# Patient Record
Sex: Male | Born: 1985
Health system: Southern US, Community
[De-identification: ages and names within clinical notes are randomized; demographics above are authoritative.]

## PROBLEM LIST (undated history)

## (undated) DIAGNOSIS — M1990 Unspecified osteoarthritis, unspecified site: Secondary | ICD-10-CM

## (undated) DIAGNOSIS — J45909 Unspecified asthma, uncomplicated: Secondary | ICD-10-CM

## (undated) DIAGNOSIS — J189 Pneumonia, unspecified organism: Secondary | ICD-10-CM

## (undated) HISTORY — PX: KNEE SURGERY: SHX244

---
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
## 2008-06-23 ENCOUNTER — Encounter: Admission: RE | Admit: 2008-06-23 | Discharge: 2008-06-23 | Payer: Self-pay | Admitting: Family Medicine

## 2009-07-06 ENCOUNTER — Encounter: Admission: RE | Admit: 2009-07-06 | Discharge: 2009-07-06 | Payer: Self-pay | Admitting: Orthopaedic Surgery

## 2009-10-06 ENCOUNTER — Encounter: Admission: RE | Admit: 2009-10-06 | Discharge: 2009-10-06 | Payer: Self-pay | Admitting: Orthopaedic Surgery

## 2009-11-07 ENCOUNTER — Encounter: Admission: RE | Admit: 2009-11-07 | Discharge: 2009-11-07 | Payer: Self-pay | Admitting: Orthopaedic Surgery

## 2010-06-07 ENCOUNTER — Encounter
Admission: RE | Admit: 2010-06-07 | Discharge: 2010-06-07 | Payer: Self-pay | Source: Home / Self Care | Attending: Internal Medicine | Admitting: Internal Medicine

## 2011-11-13 ENCOUNTER — Ambulatory Visit (INDEPENDENT_AMBULATORY_CARE_PROVIDER_SITE_OTHER): Payer: Self-pay | Admitting: Sports Medicine

## 2011-11-13 VITALS — BP 120/70 | Ht 72.0 in | Wt 240.0 lb

## 2011-11-13 DIAGNOSIS — R269 Unspecified abnormalities of gait and mobility: Secondary | ICD-10-CM

## 2011-11-13 DIAGNOSIS — M79671 Pain in right foot: Secondary | ICD-10-CM

## 2011-11-13 DIAGNOSIS — M79609 Pain in unspecified limb: Secondary | ICD-10-CM

## 2011-11-13 NOTE — Assessment & Plan Note (Signed)
Patient was fitted for a : standard, cushioned, semi-rigid orthotic. The orthotic was heated and afterward the patient stood on the orthotic blank positioned on the orthotic stand. The patient was positioned in subtalar neutral position and 10 degrees of ankle dorsiflexion in a weight bearing stance. After completion of molding, a stable base was applied to the orthotic blank. The blank was ground to a stable position for weight bearing. Size: 12 black suede EVA and red EVA Base: medium blue EVA Posting:none Additional orthotic padding:none  Gait was much improved in both his running shoes and his golf shoes when we placed orthotics in them  I think he needs to continue in orthotics as his gait is so abnormal that he would be very high risk for midfoot arthritis and ankle arthritis in the future   Use these and we will recheck him if he has difficulty with them

## 2011-11-13 NOTE — Assessment & Plan Note (Signed)
I think this is related to his biomechanical issues He has good posterior tibialis function He does not appear to have any functional spring ligament on the right or left which leads to marked pes planus and midfoot rotation

## 2011-11-13 NOTE — Progress Notes (Signed)
Patient ID: Bob [name], male   DOB: 03-28-1986, 26 y.o.   MRN: [id number]  HPI:  New pt evaluation for foot pain "all my life." Sent for evaluation by Dr Thurston Hole  Pt complains of having flat feet.  Is active and feels like his toes have blisters frequently because he does not have an arch.  + soreness on whole bottom of feet, especially after a lot of activity/running/golfing.  Has had orthotics by a foot specialist previously-- were plastic and painful.  No medications.  Walks/run ~ 20-30 mil per week (between running for exercise and golfing 18 holes ~4x/week)  Now having back pain-- ruptures L4-5, narrowing of canal and bulging disk-- improved with strengthening exercises and weight loss; 2011, required steroid injections, not helped by po pain meds; has L left numbness which has resolved   Surgery: h/o surgery on R knee for partial MCL tear-- 2003 or 2004  PE:  Filed Vitals:   11/13/11 1131  BP: 120/70   Gen; NAD, pleasant Feet:  + callus mid forefoot bilaterally Healing blisters between toes, left worse than right Fallen arch bilateral feet, collapse of mid-foot bilaterally Increased spacing between 1st and 2nd toes bilaterally External rotation of bilateral feet with walking  Midfoot collapse and rotation Rear foot shows calcaneus is neutral  gais shows marked pronation

## 2013-04-22 ENCOUNTER — Ambulatory Visit (INDEPENDENT_AMBULATORY_CARE_PROVIDER_SITE_OTHER): Payer: PRIVATE HEALTH INSURANCE | Admitting: Psychology

## 2013-04-22 DIAGNOSIS — F6381 Intermittent explosive disorder: Secondary | ICD-10-CM

## 2013-05-06 ENCOUNTER — Ambulatory Visit: Payer: 59 | Admitting: Psychology

## 2014-02-16 ENCOUNTER — Emergency Department (HOSPITAL_COMMUNITY)
Admission: EM | Admit: 2014-02-16 | Discharge: 2014-02-16 | Disposition: A | Discharge status: Home / Self Care | Payer: 59 | Attending: Emergency Medicine | Admitting: Emergency Medicine

## 2014-02-16 ENCOUNTER — Encounter (HOSPITAL_COMMUNITY): Payer: Self-pay | Admitting: Emergency Medicine

## 2014-02-16 DIAGNOSIS — J45909 Unspecified asthma, uncomplicated: Secondary | ICD-10-CM | POA: Diagnosis not present

## 2014-02-16 DIAGNOSIS — L03211 Cellulitis of face: Secondary | ICD-10-CM | POA: Diagnosis not present

## 2014-02-16 DIAGNOSIS — L0201 Cutaneous abscess of face: Secondary | ICD-10-CM | POA: Insufficient documentation

## 2014-02-16 HISTORY — DX: Unspecified asthma, uncomplicated: J45.909

## 2014-02-16 MED ORDER — SULFAMETHOXAZOLE-TRIMETHOPRIM 800-160 MG PO TABS: 1.0000 | ORAL_TABLET | Freq: Two times a day (BID) | ORAL | Status: DC

## 2014-02-16 MED ORDER — SULFAMETHOXAZOLE-TMP DS 800-160 MG PO TABS
1.0000 | ORAL_TABLET | Freq: Once | ORAL | Status: AC
  Administered 2014-02-16: 1 via ORAL
  Filled 2014-02-16: qty 1

## 2014-02-16 MED ORDER — OXYCODONE-ACETAMINOPHEN 5-325 MG PO TABS: 1.0000 | ORAL_TABLET | ORAL | Status: DC | PRN

## 2014-02-16 MED ORDER — OXYCODONE-ACETAMINOPHEN 5-325 MG PO TABS
1.0000 | ORAL_TABLET | Freq: Once | ORAL | Status: AC
  Administered 2014-02-16: 1 via ORAL
  Filled 2014-02-16: qty 1

## 2014-02-16 NOTE — Discharge Instructions (Signed)

## 2014-02-16 NOTE — ED Provider Notes (Signed)
CSN: [id number]     Arrival date & time 02/16/14  6295 History   First MD Initiated Contact with Patient 02/16/14 [phone]     Chief Complaint  Patient presents with  . Insect Bite     (Consider location/radiation/quality/duration/timing/severity/associated sxs/prior Treatment) Patient is a 28 y.o. male presenting with abscess. The history is provided by the patient. No language interpreter was used.  Abscess Location:  Face Facial abscess location:  L cheek Size:  Small Abscess quality: painful   Abscess quality: not draining   Red streaking: no   Associated symptoms: no fever   Associated symptoms comment:  Small "pimple" to left cheek he tried to open at home yesterday. Today pain and swelling around the area has increased. No fever. No drainage from the area.    Past Medical History  Diagnosis Date  . Asthma    Past Surgical History  Procedure Laterality Date  . Knee surgery     No family history on file. History  Substance Use Topics  . Smoking status: Never Smoker   . Smokeless tobacco: Not on file  . Alcohol Use: Yes     Comment: social    Review of Systems  Constitutional: Negative for fever.  HENT: Positive for facial swelling.   Eyes: Negative for pain.  Skin: Positive for wound.      Allergies  Review of patient's allergies indicates no known allergies.  Home Medications   Prior to Admission medications   Not on File   BP 131/79  Pulse 65  Temp(Src) 97.6 F (36.4 C) (Oral)  Resp 16  SpO2 97% Physical Exam  Constitutional: He is oriented to person, place, and time. He appears well-developed and well-nourished.  Eyes: Left eye exhibits no discharge.  FROM without pain.  Neck: Normal range of motion. Neck supple.  Pulmonary/Chest: Effort normal.  Musculoskeletal: Normal range of motion.  Lymphadenopathy:    He has no cervical adenopathy.  Neurological: He is alert and oriented to person, place, and time.  Skin: Skin is warm and dry.  Small  pustule left cheek lateral to eye. No erythema. Significantly tender. No fluctuance or induration.   Psychiatric: He has a normal mood and affect.    ED Course  Procedures (including critical care time) Labs Review Labs Reviewed - No data to display  Imaging Review No results found.   EKG Interpretation None      MDM   Final diagnoses:  None    1. Facial abscess  No fluctuance or induration so will not attempt to I&D. Will place on abx, provide pain management and strongly encourage close follow up with PCP Kirby Funk) for recheck.     Arnoldo Hooker, PA-C 02/16/14 [phone]

## 2014-02-16 NOTE — ED Notes (Signed)
Here for wound/abscess/bite check. Noticed area yesterday. Thought it was a pimple and tried to "pop" it.  Minimal redness & swelling.  Denies pain, but TTP. (Denies: itching, cramping, nvd or other sx). No drainage noted. Mentions blurry vision. No meds or topicals PTA.

## 2014-02-17 NOTE — ED Provider Notes (Signed)
Medical screening examination/treatment/procedure(s) were performed by non-physician practitioner and as supervising physician I was immediately available for consultation/collaboration.   EKG Interpretation None       Derwood Kaplan, MD 02/17/14 8119

## 2014-05-14 HISTORY — PX: OTHER SURGICAL HISTORY: SHX169

## 2014-11-02 ENCOUNTER — Ambulatory Visit (INDEPENDENT_AMBULATORY_CARE_PROVIDER_SITE_OTHER): Payer: 59 | Admitting: Internal Medicine

## 2014-11-02 ENCOUNTER — Encounter: Payer: Self-pay | Admitting: Internal Medicine

## 2014-11-02 VITALS — BP 122/80 | HR 64 | Temp 98.0°F | Resp 18 | Ht 72.0 in | Wt 277.0 lb

## 2014-11-02 DIAGNOSIS — Z1329 Encounter for screening for other suspected endocrine disorder: Secondary | ICD-10-CM

## 2014-11-02 DIAGNOSIS — Z1389 Encounter for screening for other disorder: Secondary | ICD-10-CM

## 2014-11-02 DIAGNOSIS — E669 Obesity, unspecified: Secondary | ICD-10-CM | POA: Insufficient documentation

## 2014-11-02 DIAGNOSIS — Z13220 Encounter for screening for lipoid disorders: Secondary | ICD-10-CM

## 2014-11-02 DIAGNOSIS — Z79899 Other long term (current) drug therapy: Secondary | ICD-10-CM | POA: Insufficient documentation

## 2014-11-02 DIAGNOSIS — Z131 Encounter for screening for diabetes mellitus: Secondary | ICD-10-CM

## 2014-11-02 DIAGNOSIS — F988 Other specified behavioral and emotional disorders with onset usually occurring in childhood and adolescence: Secondary | ICD-10-CM

## 2014-11-02 DIAGNOSIS — Z130 Encounter for screening for diseases of the blood and blood-forming organs and certain disorders involving the immune mechanism: Secondary | ICD-10-CM

## 2014-11-02 DIAGNOSIS — E559 Vitamin D deficiency, unspecified: Secondary | ICD-10-CM | POA: Insufficient documentation

## 2014-11-02 DIAGNOSIS — R5383 Other fatigue: Secondary | ICD-10-CM

## 2014-11-02 DIAGNOSIS — J45909 Unspecified asthma, uncomplicated: Secondary | ICD-10-CM | POA: Insufficient documentation

## 2014-11-02 DIAGNOSIS — Z0000 Encounter for general adult medical examination without abnormal findings: Secondary | ICD-10-CM

## 2014-11-02 LAB — MAGNESIUM: Magnesium: 2.1 mg/dL (ref 1.5–2.5)

## 2014-11-02 LAB — BASIC METABOLIC PANEL WITH GFR
BUN: 17 mg/dL (ref 6–23)
CALCIUM: 9.1 mg/dL (ref 8.4–10.5)
CO2: 25 meq/L (ref 19–32)
CREATININE: 0.86 mg/dL (ref 0.50–1.35)
Chloride: 101 mEq/L (ref 96–112)
GFR, Est African American: >89 mL/min
GFR, Est Non African American: >89 mL/min
GLUCOSE: 98 mg/dL (ref 70–99)
Potassium: 4.7 mEq/L (ref 3.5–5.3)
Sodium: 137 mEq/L (ref 135–145)

## 2014-11-02 LAB — HEPATIC FUNCTION PANEL
ALK PHOS: 91 U/L (ref 39–117)
ALT: 31 U/L (ref 0–53)
AST: 28 U/L (ref 0–37)
Albumin: 4.4 g/dL (ref 3.5–5.2)
BILIRUBIN INDIRECT: 0.2 mg/dL (ref 0.2–1.2)
Bilirubin, Direct: 0.1 mg/dL (ref 0.0–0.3)
Total Bilirubin: 0.3 mg/dL (ref 0.2–1.2)
Total Protein: 6.9 g/dL (ref 6.0–8.3)

## 2014-11-02 LAB — CBC WITH DIFFERENTIAL/PLATELET
BASOS ABS: 0.1 10*3/uL (ref 0.0–0.1)
BASOS PCT: 1 % (ref 0–1)
EOS ABS: 0.1 10*3/uL (ref 0.0–0.7)
Eosinophils Relative: 2 % (ref 0–5)
HCT: 42.2 % (ref 39.0–52.0)
Hemoglobin: 14.3 g/dL (ref 13.0–17.0)
LYMPHS ABS: 1.8 10*3/uL (ref 0.7–4.0)
LYMPHS PCT: 25 % (ref 12–46)
MCH: 29.6 pg (ref 26.0–34.0)
MCHC: 33.9 g/dL (ref 30.0–36.0)
MCV: 87.4 fL (ref 78.0–100.0)
MPV: 9 fL (ref 8.6–12.4)
Monocytes Absolute: 0.5 10*3/uL (ref 0.1–1.0)
Monocytes Relative: 7 % (ref 3–12)
NEUTROS ABS: 4.6 10*3/uL (ref 1.7–7.7)
NEUTROS PCT: 65 % (ref 43–77)
Platelets: 299 10*3/uL (ref 150–400)
RBC: 4.83 MIL/uL (ref 4.22–5.81)
RDW: 13.7 % (ref 11.5–15.5)
WBC: 7.1 10*3/uL (ref 4.0–10.5)

## 2014-11-02 LAB — IRON AND TIBC
%SAT: 31 % (ref 20–55)
Iron: 124 ug/dL (ref 42–165)
TIBC: 395 ug/dL (ref 215–435)
UIBC: 271 ug/dL (ref 125–400)

## 2014-11-02 LAB — VITAMIN B12: Vitamin B-12: 407 pg/mL (ref 211–911)

## 2014-11-02 LAB — LIPID PANEL
CHOL/HDL RATIO: 5 ratio
CHOLESTEROL: 219 mg/dL — AB (ref 0–200)
HDL: 44 mg/dL (ref >=40)
LDL CALC: 124 mg/dL — AB (ref 0–99)
Triglycerides: 256 mg/dL — ABNORMAL HIGH (ref <150)
VLDL: 51 mg/dL — ABNORMAL HIGH (ref 0–40)

## 2014-11-02 LAB — HEMOGLOBIN A1C
HEMOGLOBIN A1C: 5.8 % — AB (ref <5.7)
Mean Plasma Glucose: 120 mg/dL — ABNORMAL HIGH (ref <117)

## 2014-11-02 LAB — TSH: TSH: 2.568 u[IU]/mL (ref 0.350–4.500)

## 2014-11-02 MED ORDER — ALBUTEROL SULFATE HFA 108 (90 BASE) MCG/ACT IN AERS: 2.0000 | INHALATION_SPRAY | Freq: Four times a day (QID) | RESPIRATORY_TRACT | Status: DC | PRN

## 2014-11-02 MED ORDER — AMPHETAMINE-DEXTROAMPHETAMINE 20 MG PO TABS: 20.0000 mg | ORAL_TABLET | Freq: Two times a day (BID) | ORAL | Status: DC

## 2014-11-02 NOTE — Patient Instructions (Signed)
Preventive Care for Adults  A healthy lifestyle and preventive care can promote health and wellness. Preventive health guidelines for men include the following key practices:  A routine yearly physical is a good way to check with your health care provider about your health and preventative screening. It is a chance to share any concerns and updates on your health and to receive a thorough exam.  Visit your dentist for a routine exam and preventative care every 6 months. Brush your teeth twice a day and floss once a day. Good oral hygiene prevents tooth decay and gum disease.  The frequency of eye exams is based on your age, health, family medical history, use of contact lenses, and other factors. Follow your health care provider's recommendations for frequency of eye exams.  Eat a healthy diet. Foods such as vegetables, fruits, whole grains, low-fat dairy products, and lean protein foods contain the nutrients you need without too many calories. Decrease your intake of foods high in solid fats, added sugars, and salt. Eat the right amount of calories for you.Get information about a proper diet from your health care provider, if necessary.  Regular physical exercise is one of the most important things you can do for your health. Most adults should get at least 150 minutes of moderate-intensity exercise (any activity that increases your heart rate and causes you to sweat) each week. In addition, most adults need muscle-strengthening exercises on 2 or more days a week.  Maintain a healthy weight. The body mass index (BMI) is a screening tool to identify possible weight problems. It provides an estimate of body fat based on height and weight. Your health care provider can find your BMI and can help you achieve or maintain a healthy weight.For adults 20 years and older:  A BMI below 18.5 is considered underweight.  A BMI of 18.5 to 24.9 is normal.  A BMI of 25 to 29.9 is considered overweight.  A  BMI of 30 and above is considered obese.  Maintain normal blood lipids and cholesterol levels by exercising and minimizing your intake of saturated fat. Eat a balanced diet with plenty of fruit and vegetables. Blood tests for lipids and cholesterol should begin at age 20 and be repeated every 5 years. If your lipid or cholesterol levels are high, you are over 50, or you are at high risk for heart disease, you may need your cholesterol levels checked more frequently.Ongoing high lipid and cholesterol levels should be treated with medicines if diet and exercise are not working.  If you smoke, find out from your health care provider how to quit. If you do not use tobacco, do not start.  Lung cancer screening is recommended for adults aged 55-80 years who are at high risk for developing lung cancer because of a history of smoking. A yearly low-dose CT scan of the lungs is recommended for people who have at least a 30-pack-year history of smoking and are a current smoker or have quit within the past 15 years. A pack year of smoking is smoking an average of 1 pack of cigarettes a day for 1 year (for example: 1 pack a day for 30 years or 2 packs a day for 15 years). Yearly screening should continue until the smoker has stopped smoking for at least 15 years. Yearly screening should be stopped for people who develop a health problem that would prevent them from having lung cancer treatment.  If you choose to drink alcohol, do not have more   than 2 drinks per day. One drink is considered to be 12 ounces (355 mL) of beer, 5 ounces (148 mL) of wine, or 1.5 ounces (44 mL) of liquor.  High blood pressure causes heart disease and increases the risk of stroke. Your blood pressure should be checked. Ongoing high blood pressure should be treated with medicines, if weight loss and exercise are not effective.  If you are 38-65 years old, ask your health care provider if you should take aspirin to prevent heart  disease.  Diabetes screening involves taking a blood sample to check your fasting blood sugar level. Testing should be considered at a younger age or be carried out more frequently if you are overweight and have at least 1 risk factor for diabetes.  Colorectal cancer can be detected and often prevented. Most routine colorectal cancer screening begins at the age of 56 and continues through age 26. However, your health care provider may recommend screening at an earlier age if you have risk factors for colon cancer. On a yearly basis, your health care provider may provide home test kits to check for hidden blood in the stool. Use of a small camera at the end of a tube to directly examine the colon (sigmoidoscopy or colonoscopy) can detect the earliest forms of colorectal cancer. Talk to your health care provider about this at age 96, when routine screening begins. Direct exam of the colon should be repeated every 5-10 years through age 69, unless early forms of precancerous polyps or small growths are found.  Screening for abdominal aortic aneurysm (AAA)  are recommended for persons over age 25 who have history of hypertensionor who are current or former smokers.  Talk with your health care provider about prostate cancer screening.  Testicular cancer screening is recommended for adult males. Screening includes self-exam, a health care provider exam, and other screening tests. Consult with your health care provider about any symptoms you have or any concerns you have about testicular cancer.  Use sunscreen. Apply sunscreen liberally and repeatedly throughout the day. You should seek shade when your shadow is shorter than you. Protect yourself by wearing long sleeves, pants, a wide-brimmed hat, and sunglasses year round, whenever you are outdoors.  Once a month, do a whole-body skin exam, using a mirror to look at the skin on your back. Tell your health care provider about new moles, moles that have  irregular borders, moles that are larger than a pencil eraser, or moles that have changed in shape or color.  Stay current with required vaccines (immunizations).  Influenza vaccine. All adults should be immunized every year.  Tetanus, diphtheria, and acellular pertussis (Td, Tdap) vaccine. An adult who has not previously received Tdap or who does not know his vaccine status should receive 1 dose of Tdap. This initial dose should be followed by tetanus and diphtheria toxoids (Td) booster doses every 10 years. Adults with an unknown or incomplete history of completing a 3-dose immunization series with Td-containing vaccines should begin or complete a primary immunization series including a Tdap dose. Adults should receive a Td booster every 10 years.  Zoster vaccine. One dose is recommended for adults aged 26 years or older unless certain conditions are present.    Pneumococcal 13-valent conjugate (PCV13) vaccine. When indicated, a person who is uncertain of his immunization history and has no record of immunization should receive the PCV13 vaccine. An adult aged 52 years or older who has certain medical conditions and has not been previously immunized  should receive 1 dose of PCV13 vaccine. This PCV13 should be followed with a dose of pneumococcal polysaccharide (PPSV23) vaccine. The PPSV23 vaccine dose should be obtained at least 8 weeks after the dose of PCV13 vaccine. An adult aged 104 years or older who has certain medical conditions and previously received 1 or more doses of PPSV23 vaccine should receive 1 dose of PCV13. The PCV13 vaccine dose should be obtained 1 or more years after the last PPSV23 vaccine dose.    Pneumococcal polysaccharide (PPSV23) vaccine. When PCV13 is also indicated, PCV13 should be obtained first. All adults aged 71 years and older should be immunized. An adult younger than age 40 years who has certain medical conditions should be immunized. Any person who resides in a  nursing home or long-term care facility should be immunized. An adult smoker should be immunized. People with an immunocompromised condition and certain other conditions should receive both PCV13 and PPSV23 vaccines. People with human immunodeficiency virus (HIV) infection should be immunized as soon as possible after diagnosis. Immunization during chemotherapy or radiation therapy should be avoided. Routine use of PPSV23 vaccine is not recommended for American Indians, [place] Natives, or people younger than 65 years unless there are medical conditions that require PPSV23 vaccine. When indicated, people who have unknown immunization and have no record of immunization should receive PPSV23 vaccine. One-time revaccination 5 years after the first dose of PPSV23 is recommended for people aged 19-64 years who have chronic kidney failure, nephrotic syndrome, asplenia, or immunocompromised conditions. People who received 1-2 doses of PPSV23 before age 82 years should receive another dose of PPSV23 vaccine at age 75 years or later if at least 5 years have passed since the previous dose. Doses of PPSV23 are not needed for people immunized with PPSV23 at or after age 49 years.  Hepatitis A vaccine. Adults who wish to be protected from this disease, have certain high-risk conditions, work with hepatitis A-infected animals, work in hepatitis A research labs, or travel to or work in countries with a high rate of hepatitis A should be immunized. Adults who were previously unvaccinated and who anticipate close contact with an international adoptee during the first 60 days after arrival in the [place] from a country with a high rate of hepatitis A should be immunized.  Hepatitis B vaccine. Adults should be immunized if they wish to be protected from this disease, have certain high-risk conditions, may be exposed to blood or other infectious body fluids, are household contacts or sex partners of hepatitis B positive  people, are clients or workers in certain care facilities, or travel to or work in countries with a high rate of hepatitis B.  Preventive Service / Frequency  Ages 59 to 45  Blood pressure check.  Lipid and cholesterol check.  Hepatitis C blood test.** / For any individual with known risks for hepatitis C.  Skin self-exam. / Monthly.  Influenza vaccine. / Every year.  Tetanus, diphtheria, and acellular pertussis (Tdap, Td) vaccine.** / Consult your health care provider. 1 dose of Td every 10 years.  HPV vaccine. / 3 doses over 6 months, if 51 or younger.  Measles, mumps, rubella (MMR) vaccine.** / You need at least 1 dose of MMR if you were born in 1957 or later. You may also need a second dose.  Pneumococcal 13-valent conjugate (PCV13) vaccine.** / Consult your health care provider.  Pneumococcal polysaccharide (PPSV23) vaccine.** / 1 to 2 doses if you smoke cigarettes or if you have  conditions.  Meningococcal vaccine.** / 1 dose if you are age 19 to 21 years and a first-year college student living in a residence hall, or have one of several medical conditions. You may also need additional booster doses.  Hepatitis A vaccine.** / Consult your health care provider.  Hepatitis B vaccine.** / Consult your health care provider. 

## 2014-11-02 NOTE — Progress Notes (Signed)
Patient ID: Bob [name], male   DOB: 10-May-1986, 29 y.o.   MRN: [id number]    Annual Screening Comprehensive Examination   This very nice 29 y.o.male with PMH of asthma who presents for complete physical and establishment at our practice.  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  He currently works out and [employer] weights multiple times per week. He also plays golf regularly.   Patient has no major health issues that he is aware of.  He has not been seen by a PCP for quite some time.     Finally, patient has history of Vitamin D Deficiency and last vitamin D was No results found for: VD25OH.  Currently on supplementation.  Patient is concerned because he is having a hard time losing weight.  His mother has hypothyroidism.  He is also concerned about his testosterone levels. He has taken HGHtest and quinbuterol in the past to help him build muscle and to help lose weight.    Patient does take Adderall and has been for quite some time.  Without the adderall he feels like he has a really hard time focusing and getting his work done.  He would like to go back on this.    Patient also admits to drinking a lot of water.  He reports that he is drinking over 256 oz of water or more.  He drinks it because he is outside all the time.    He reports that he sleeps 4 hours a night.  He reports that he feels that he sleeps well.  His wife does report that he snores.    Patient had a tetanus shot in 2013.     No current outpatient prescriptions on file prior to visit.   No current facility-administered medications on file prior to visit.    No Known Allergies  Past Medical History  Diagnosis Date  . Asthma     There is no immunization history on file for this patient.  Past Surgical History  Procedure Laterality Date  . Knee surgery      No family history on file.  History   Social History  . Marital Status: Married    Spouse Name: N/A  . Number of  Children: N/A  . Years of Education: N/A   Occupational History  . Not on file.   Social History Main Topics  . Smoking status: Never Smoker   . Smokeless tobacco: Current User    Types: Chew  . Alcohol Use: 0.0 oz/week    0 Standard drinks or equivalent per week     Comment: social  . Drug Use: No  . Sexual Activity: Not on file   Other Topics Concern  . Not on file   Social History Narrative   Review of Systems  Constitutional: Negative for fever, chills, weight loss and malaise/fatigue.  HENT: Negative for congestion, ear pain, sore throat and tinnitus.   Eyes: Negative for blurred vision and double vision.  Respiratory: Negative for cough, shortness of breath and wheezing.   Cardiovascular: Negative for chest pain, palpitations and leg swelling.  Gastrointestinal: Negative for heartburn, nausea, vomiting, abdominal pain, diarrhea, constipation, blood in stool and melena.  Genitourinary: Negative for dysuria, urgency and frequency.  Musculoskeletal: Negative for myalgias and joint pain.  Skin: Negative.   Neurological: Negative for dizziness, sensory change and headaches.  Psychiatric/Behavioral: Negative for depression. The patient is not nervous/anxious and does not have insomnia.      Physical  Exam  BP 122/80 mmHg  Pulse 72  Temp(Src) 98 F (36.7 C) (Temporal)  Resp 18  Ht 6' (1.829 m)  Wt 277 lb (125.646 kg)  BMI 37.56 kg/m2  General Appearance: Well nourished and in no apparent distress. Eyes: PERRLA, EOMs, conjunctiva no swelling or erythema, normal fundi and vessels. Sinuses: No frontal/maxillary tenderness ENT/Mouth: EACs patent / TMs  nl. Nares clear without erythema, swelling, mucoid exudates. Oral hygiene is good. No erythema, swelling, or exudate. Tongue normal, non-obstructing. Tonsils not swollen or erythematous. Hearing normal.  Neck: Supple, thyroid normal. No bruits, nodes or JVD. Respiratory: Respiratory effort normal.  BS equal and clear  bilateral without rales, rhonci, wheezing or stridor. Cardio: Heart sounds are normal with regular rate and rhythm and no murmurs, rubs or gallops. Peripheral pulses are normal and equal bilaterally without edema. No aortic or femoral bruits. Chest: symmetric with normal excursions and percussion. Breasts: Symmetric, without lumps, nipple discharge, retractions, or fibrocystic changes.  Abdomen: Flat, soft, with bowl sounds. Nontender, no guarding, rebound, hernias, masses, or organomegaly.  Lymphatics: Non tender without lymphadenopathy.  Genitourinary:  Patient deferred Musculoskeletal: Full ROM all peripheral extremities, joint stability, 5/5 strength, and normal gait. Skin: Warm and dry without rashes, lesions, cyanosis, clubbing or  ecchymosis.  Neuro: Cranial nerves intact, reflexes equal bilaterally. Normal muscle tone, no cerebellar symptoms. Sensation intact.  Pysch: Awake and oriented X 3, normal affect, Insight and Judgment appropriate.   Assessment and Plan    1. Vitamin D deficiency -encouraged to start taking supplementation - Vit D  25 hydroxy (rtn osteoporosis monitoring)  2. Screening for diabetes mellitus  - Hemoglobin A1c - Insulin, random  3. Screening for hypothyroidism -TSH  4. Screening for hyperlipidemia -work on diet and exercise and portion control - Lipid panel  5. Screening for hematuria or proteinuria  - Urinalysis, Routine w reflex microscopic (not at [facility]) - Microalbumin / creatinine urine ratio  6. Screening for deficiency anemia -Vit B complex vitamin okay to take - Iron and TIBC - Vitamin B12  7. Obesity  - EKG 12-Lead  8. Other fatigue  - Iron and TIBC - Vitamin B12 - Testosterone - TSH  9. Medication management  - CBC with Differential/Platelet - BASIC METABOLIC PANEL WITH GFR - Hepatic function panel - Magnesium  10. ADD (attention deficit disorder)  - amphetamine-dextroamphetamine (ADDERALL) 20 MG tablet; Take 1 tablet  (20 mg total) by mouth 2 (two) times daily.  Dispense: 60 tablet; Refill: 0 -recommended this over extended release to help him sleep better.  EKG:  WNL  Continue prudent diet as discussed, weight control, regular exercise, and medications. Routine screening labs and tests as requested with regular follow-up as recommended.  Over 40 minutes of exam, counseling, chart review and critical decision making was performed

## 2014-11-03 LAB — MICROALBUMIN / CREATININE URINE RATIO
Creatinine, Urine: 162.6 mg/dL
Microalb Creat Ratio: 2.5 mg/g (ref 0.0–30.0)
Microalb, Ur: 0.4 mg/dL (ref <2.0)

## 2014-11-03 LAB — URINALYSIS, ROUTINE W REFLEX MICROSCOPIC
Bilirubin Urine: NEGATIVE
Glucose, UA: NEGATIVE mg/dL
Hgb urine dipstick: NEGATIVE
Ketones, ur: NEGATIVE mg/dL
Leukocytes, UA: NEGATIVE
Nitrite: NEGATIVE
Protein, ur: NEGATIVE mg/dL
Specific Gravity, Urine: 1.011 (ref 1.005–1.030)
Urobilinogen, UA: 0.2 mg/dL (ref 0.0–1.0)
pH: 6 (ref 5.0–8.0)

## 2014-11-03 LAB — VITAMIN D 25 HYDROXY (VIT D DEFICIENCY, FRACTURES): Vit D, 25-Hydroxy: 13 ng/mL — ABNORMAL LOW (ref 30–100)

## 2014-11-03 LAB — TESTOSTERONE: Testosterone: 149 ng/dL — ABNORMAL LOW (ref 300–890)

## 2014-11-03 LAB — INSULIN, RANDOM: Insulin: 13.9 u[IU]/mL (ref 2.0–19.6)

## 2014-11-04 ENCOUNTER — Other Ambulatory Visit: Payer: Self-pay | Admitting: *Deleted

## 2014-11-04 MED ORDER — TESTOSTERONE CYPIONATE 200 MG/ML IM SOLN: 300.0000 mg | INTRAMUSCULAR | Status: DC

## 2014-11-04 MED ORDER — "SYRINGE 21G X 1"" 3 ML MISC": Status: DC

## 2014-11-24 ENCOUNTER — Other Ambulatory Visit: Payer: Self-pay

## 2015-04-04 ENCOUNTER — Encounter: Payer: Self-pay | Admitting: Allergy and Immunology

## 2015-04-04 ENCOUNTER — Ambulatory Visit (INDEPENDENT_AMBULATORY_CARE_PROVIDER_SITE_OTHER): Payer: 59 | Admitting: Allergy and Immunology

## 2015-04-04 VITALS — BP 130/82 | HR 80 | Temp 97.5°F | Resp 16 | Ht 70.87 in | Wt 270.9 lb

## 2015-04-04 DIAGNOSIS — J3089 Other allergic rhinitis: Secondary | ICD-10-CM

## 2015-04-04 DIAGNOSIS — J4540 Moderate persistent asthma, uncomplicated: Secondary | ICD-10-CM | POA: Diagnosis not present

## 2015-04-04 MED ORDER — ALBUTEROL SULFATE 108 (90 BASE) MCG/ACT IN AEPB: 2.0000 | INHALATION_SPRAY | RESPIRATORY_TRACT | Status: DC | PRN

## 2015-04-04 MED ORDER — FLUTICASONE PROPIONATE 50 MCG/ACT NA SUSP: 2.0000 | Freq: Every day | NASAL | Status: DC

## 2015-04-04 MED ORDER — FLUTICASONE FUROATE-VILANTEROL 100-25 MCG/INH IN AEPB
INHALATION_SPRAY | RESPIRATORY_TRACT | Status: DC
Start: 2015-04-04 — End: 2015-09-25

## 2015-04-04 NOTE — Progress Notes (Signed)
History of present illness: HPI Comments: Bob [name] is a 29 y.o. male who presents today for his initial consultation for asthma.  He reports that he was diagnosed with asthma when he was 25 or 29 years old.  His asthma had been well-controlled with Advair, however he ran out of this medication approximately 2 months ago.  He currently experiences nocturnal awakenings due to dyspnea and cough 2 or 3 nights per week.  He has a history of allergic rhinitis and was skin test positive several years ago to grass pollen, dog, cat, and dust mite antigen.   Assessment and plan: Moderate persistent asthma  A prescription has been provided for Breo Ellipta (fluticasone furoate/vilanterol) 100/25 g, one inhalation daily.  A prescription has been provided for ProAir Respiclick, 1-2 inhalations every 4-6 hours as needed.  Subjective and objective measures of pulmonary function will be followed and the treatment plan will be adjusted accordingly.    Other allergic rhinitis  Aeroallergen avoidance measures have been discussed and provided in written form.  A prescription has been provided for fluticasone nasal spray, 2 sprays per nostril daily as needed. Proper nasal spray technique has been discussed and demonstrated.     Medications ordered this encounter: Meds ordered this encounter  Medications  . Fluticasone Furoate-Vilanterol (BREO ELLIPTA) 100-25 MCG/INH AEPB    Sig: INHALE ONE PUFF ONCE DAILY TO PREVENT COUGH OR WHEEZE. RINSE, GARGLE, AND SPIT AFTER USE.    Dispense:  1 each    Refill:  5    PT HAS FREE TRIAL OFFER COUPON  . Albuterol Sulfate (PROAIR RESPICLICK) 108 (90 BASE) MCG/ACT AEPB    Sig: Inhale 2 puffs into the lungs every 4 (four) hours as needed (FOR COUGH OR WHEEZE).    Dispense:  1 each    Refill:  1    PT HAS COUPON  . fluticasone (FLONASE) 50 MCG/ACT nasal spray    Sig: Place 2 sprays into both nostrils daily.    Dispense:  16 g    Refill:  5     Diagnositics: Spirometry: FVC is 5.82 L (104% predicted) and FEV1 is 4.47 L (96% predicted) with 230 mL postbronchodilator improvement.    Physical examination: Blood pressure 130/82, pulse 80, temperature 97.5 F (36.4 C), temperature source Oral, resp. rate 16, height 5' 10.87" (1.8 m), weight 270 lb 15.1 oz (122.9 kg).  General: Alert, interactive, in no acute distress. HEENT: TMs pearly gray, turbinates moderately edematous without discharge, post-pharynx mildly erythematous. Neck: Supple without lymphadenopathy. Lungs: Clear to auscultation without wheezing, rhonchi or rales. CV: Normal S1, S2 without murmurs. Abdomen: Nondistended, nontender. Skin: Warm and dry, without lesions or rashes. Extremities:  No clubbing, cyanosis or edema. Neuro:   Grossly intact.  Review of systems: Review of Systems  Constitutional: Negative for fever, chills and weight loss.  HENT: Negative for nosebleeds.   Eyes: Negative for blurred vision.  Respiratory: Negative for hemoptysis.   Cardiovascular: Negative for chest pain.  Gastrointestinal: Negative for diarrhea and constipation.  Genitourinary: Negative for dysuria.  Musculoskeletal: Negative for myalgias and joint pain.  Neurological: Negative for dizziness.  Endo/Heme/Allergies: Does not bruise/bleed easily.    Past medical history: Past Medical History  Diagnosis Date  . Asthma     Past surgical history: Past Surgical History  Procedure Laterality Date  . Knee surgery    . Left wrist surgery Left 05/14/2014    Family history: History reviewed. No pertinent family history.  Social history: Social History  Social History  . Marital Status: Married    Spouse Name: N/A  . Number of Children: N/A  . Years of Education: N/A   Occupational History  . Not on file.   Social History Main Topics  . Smoking status: Never Smoker   . Smokeless tobacco: Current User    Types: Chew  . Alcohol Use: 3.0 oz/week    5  Standard drinks or equivalent per week     Comment: social  . Drug Use: No  . Sexual Activity: Not on file   Other Topics Concern  . Not on file   Social History Narrative   Environmental History:  Bob [name] lives in a 28 year old house with hardwood floors throughout and central air/heat.  There is a dog in the house which has access to his bedroom.  He is a nonsmoker and is not exposed to significant secondhand cigarette smoke.  Known medication allergies: No Known Allergies  Outpatient medications:   Medication List       This list is accurate as of: 04/04/15  1:30 PM.  Always use your most recent med list.               Albuterol Sulfate 108 (90 BASE) MCG/ACT Aepb  Commonly known as:  PROAIR RESPICLICK  Inhale 2 puffs into the lungs every 4 (four) hours as needed (FOR COUGH OR WHEEZE).     amphetamine-dextroamphetamine 20 MG tablet  Commonly known as:  ADDERALL  Take 1 tablet (20 mg total) by mouth 2 (two) times daily.     fluticasone 50 MCG/ACT nasal spray  Commonly known as:  FLONASE  Place 2 sprays into both nostrils daily.     Fluticasone Furoate-Vilanterol 100-25 MCG/INH Aepb  Commonly known as:  BREO ELLIPTA  INHALE ONE PUFF ONCE DAILY TO PREVENT COUGH OR WHEEZE. RINSE, GARGLE, AND SPIT AFTER USE.     Fluticasone-Salmeterol 100-50 MCG/DOSE Aepb  Commonly known as:  ADVAIR  Inhale 1 puff into the lungs 2 (two) times daily.     SYRINGE 3CC/21GX1" 21G X 1" 3 ML Misc  UAD with testosterone cypionate Rx.     testosterone cypionate 200 MG/ML injection  Commonly known as:  DEPOTESTOSTERONE CYPIONATE  Inject 1.5 mLs (300 mg total) into the muscle every 21 ( twenty-one) days.        I appreciate the opportunity to take part in this Bob [name]'s care. Please do not hesitate to contact me with questions.  Sincerely,   R. Jorene Guestarter Bob Pew, MD

## 2015-04-04 NOTE — Assessment & Plan Note (Signed)
   Aeroallergen avoidance measures have been discussed and provided in written form.  A prescription has been provided for fluticasone nasal spray, 2 sprays per nostril daily as needed. Proper nasal spray technique has been discussed and demonstrated. 

## 2015-04-04 NOTE — Patient Instructions (Addendum)
Moderate persistent asthma  A prescription has been provided for Breo Ellipta (fluticasone furoate/vilanterol) 100/25 g, one inhalation daily.  A prescription has been provided for ProAir Respiclick, 1-2 inhalations every 4-6 hours as needed.  Subjective and objective measures of pulmonary function will be followed and the treatment plan will be adjusted accordingly.    Other allergic rhinitis  Aeroallergen avoidance measures have been discussed and provided in written form.  A prescription has been provided for fluticasone nasal spray, 2 sprays per nostril daily as needed. Proper nasal spray technique has been discussed and demonstrated.     Return in about 6 weeks (around 05/16/2015), or if symptoms worsen or fail to improve.  Control of Dog or Cat Allergen  Avoidance is the best way to manage a dog or cat allergy. If you have a dog or cat and are allergic to dog or cats, consider removing the dog or cat from the home. If you have a dog or cat but don't want to find it a new home, or if your family wants a pet even though someone in the household is allergic, here are some strategies that may help keep symptoms at bay:  1. Keep the pet out of your bedroom and restrict it to only a few rooms. Be advised that keeping the dog or cat in only one room will not limit the allergens to that room. 2. Don't pet, hug or kiss the dog or cat; if you do, wash your hands with soap and water. 3. High-efficiency particulate air (HEPA) cleaners run continuously in a bedroom or living room can reduce allergen levels over time. 4. Regular use of a high-efficiency vacuum cleaner or a central vacuum can reduce allergen levels. 5. Giving your dog or cat a bath at least once a week can reduce airborne allergen.  Reducing Pollen Exposure  The American Academy of Allergy, Asthma and Immunology suggests the following steps to reduce your exposure to pollen during allergy seasons.    1. Do not hang sheets  or clothing out to dry; pollen may collect on these items. 2. Do not mow lawns or spend time around freshly cut grass; mowing stirs up pollen. 3. Keep windows closed at night.  Keep car windows closed while driving. 4. Minimize morning activities outdoors, a time when pollen counts are usually at their highest. 5. Stay indoors as much as possible when pollen counts or humidity is high and on windy days when pollen tends to remain in the air longer. 6. Use air conditioning when possible.  Many air conditioners have filters that trap the pollen spores. 7. Use a HEPA room air filter to remove pollen form the indoor air you breathe.   Control of House Dust Mite Allergen  House dust mites play a major role in allergic asthma and rhinitis.  They occur in environments with high humidity wherever human skin, the food for dust mites is found. High levels have been detected in dust obtained from mattresses, pillows, carpets, upholstered furniture, bed covers, clothes and soft toys.  The principal allergen of the house dust mite is found in its feces.  A gram of dust may contain 1,000 mites and 250,000 fecal particles.  Mite antigen is easily measured in the air during house cleaning activities.    1. Encase mattresses, including the box spring, and pillow, in an air tight cover.  Seal the zipper end of the encased mattresses with wide adhesive tape. 2. Wash the bedding in water of 130 degrees  Farenheit weekly.  Avoid cotton comforters/quilts and flannel bedding: the most ideal bed covering is the dacron comforter. 3. Remove all upholstered furniture from the bedroom. 4. Remove carpets, carpet padding, rugs, and non-washable window drapes from the bedroom.  Wash drapes weekly or use plastic window coverings. 5. Remove all non-washable stuffed toys from the bedroom.  Wash stuffed toys weekly. 6. Have the room cleaned frequently with a vacuum cleaner and a damp dust-mop.  The patient should not be in a room  which is being cleaned and should wait 1 hour after cleaning before going into the room. 7. Close and seal all heating outlets in the bedroom.  Otherwise, the room will become filled with dust-laden air.  An electric heater can be used to heat the room. 8. Reduce indoor humidity to less than 50%.  Do not use a humidifier.

## 2015-04-04 NOTE — Assessment & Plan Note (Signed)
   A prescription has been provided for Breo Ellipta (fluticasone furoate/vilanterol) 100/25 g, one inhalation daily.  A prescription has been provided for ProAir Respiclick, 1-2 inhalations every 4-6 hours as needed.  Subjective and objective measures of pulmonary function will be followed and the treatment plan will be adjusted accordingly.

## 2015-05-02 ENCOUNTER — Ambulatory Visit: Payer: 59 | Admitting: Allergy and Immunology

## 2015-05-09 ENCOUNTER — Ambulatory Visit: Payer: 59 | Admitting: Allergy and Immunology

## 2015-05-11 ENCOUNTER — Ambulatory Visit: Payer: Self-pay | Admitting: Internal Medicine

## 2015-05-16 ENCOUNTER — Ambulatory Visit: Payer: 59 | Admitting: Allergy and Immunology

## 2015-06-06 ENCOUNTER — Ambulatory Visit: Payer: 59 | Admitting: Allergy and Immunology

## 2015-06-07 ENCOUNTER — Ambulatory Visit: Payer: 59 | Admitting: Allergy and Immunology

## 2015-09-25 ENCOUNTER — Other Ambulatory Visit: Payer: Self-pay | Admitting: Allergy and Immunology

## 2015-09-25 NOTE — Telephone Encounter (Signed)
PATIENT NEEDS OFFICE.

## 2015-10-27 ENCOUNTER — Other Ambulatory Visit: Payer: Self-pay | Admitting: Allergy and Immunology

## 2015-11-05 ENCOUNTER — Other Ambulatory Visit: Payer: Self-pay | Admitting: Allergy and Immunology

## 2015-11-08 ENCOUNTER — Other Ambulatory Visit: Payer: Self-pay | Admitting: Allergy and Immunology

## 2015-11-08 NOTE — Telephone Encounter (Signed)
Pt called and made appointment for July 13 with Dr Nunzio CobbsBobbitt but needs a refill on his breo 100-25. [facility]

## 2015-11-09 MED ORDER — FLUTICASONE FUROATE-VILANTEROL 100-25 MCG/INH IN AEPB: 1.0000 | INHALATION_SPRAY | Freq: Every day | RESPIRATORY_TRACT | Status: DC

## 2015-11-09 NOTE — Telephone Encounter (Signed)
SPOKE WITH PATIENT ADVISED WE WOULD SEND MEDICATION TO [facility] IN [place], [place]. BREO SENT IN

## 2015-11-09 NOTE — Telephone Encounter (Signed)
Put a telephone contact back yesterday afternoon and the pt called and said he had not heard anything and he is heading out of town now so he needs us to send it to [facility] in [place] number is [phone]. He made appointment with dr Nunzio Cobbsbobbitt in July.

## 2015-12-01 ENCOUNTER — Other Ambulatory Visit: Payer: Self-pay | Admitting: Allergy and Immunology

## 2015-12-14 ENCOUNTER — Encounter (INDEPENDENT_AMBULATORY_CARE_PROVIDER_SITE_OTHER): Payer: Self-pay

## 2015-12-14 ENCOUNTER — Encounter: Payer: Self-pay | Admitting: Allergy and Immunology

## 2015-12-14 ENCOUNTER — Ambulatory Visit (INDEPENDENT_AMBULATORY_CARE_PROVIDER_SITE_OTHER): Payer: 59 | Admitting: Allergy and Immunology

## 2015-12-14 VITALS — BP 126/68 | HR 70 | Temp 98.1°F | Resp 16

## 2015-12-14 DIAGNOSIS — J3089 Other allergic rhinitis: Secondary | ICD-10-CM | POA: Diagnosis not present

## 2015-12-14 DIAGNOSIS — J4540 Moderate persistent asthma, uncomplicated: Secondary | ICD-10-CM | POA: Diagnosis not present

## 2015-12-14 MED ORDER — ALBUTEROL SULFATE 108 (90 BASE) MCG/ACT IN AEPB
2.0000 | INHALATION_SPRAY | RESPIRATORY_TRACT | Status: DC
Start: 2015-12-14 — End: 2016-04-15

## 2015-12-14 MED ORDER — FLUTICASONE FUROATE-VILANTEROL 200-25 MCG/INH IN AEPB: 1.0000 | INHALATION_SPRAY | Freq: Every day | RESPIRATORY_TRACT | Status: DC

## 2015-12-14 NOTE — Patient Instructions (Signed)
Moderate persistent asthma  A sample and prescription have been provided for Breo Ellipta 200/25 g, one inhalation daily. Hold Breo Ellipta 100/25 g for now.  Continue albuterol HFA, 1-2 inhalations every 4-6 hours as needed.  Subjective and objective measures of pulmonary function will be followed and the treatment plan will be adjusted accordingly.  Allergic rhinitis  Continue appropriate aeroallergen avoidance measures and fluticasone nasal spray as needed.   No Follow-up on file.

## 2015-12-14 NOTE — Progress Notes (Signed)
Follow-up Note  RE: Bob MapleMichael Watling MRN: [id number] DOB: [birth year] Date of Office Visit: 12/14/2015  Primary care provider: Nadean CorwinMCKEOWN,WILLIAM DAVID, MD Referring provider: Lucky CowboyMcKeown, William, MD  History of present illness: HPI Comments: Bob [name] is a 30 y.o. male with persistent asthma and allergic rhinitis presenting today for follow up.  He was last seen in this clinic in November 2016.  He reports that approximately one month ago he experienced nocturnal awakenings due to lower rest or symptoms 3 nights in a row.  He states that he woke up and "could not breathe.".  Albuterol relieved the symptoms.  His asthma is well-controlled during the day.  He reports that he has had nocturnal awakenings due to lower rest or symptoms on 3 occasions over the past for 5 months.  He believes that all of these occasions occurred while he was on oral prednisone for back pain.  He has no nasal symptom complaints today.   Assessment and plan: Moderate persistent asthma  A sample and prescription have been provided for Breo Ellipta 200/25 g, one inhalation daily. Hold Breo Ellipta 100/25 g for now.  Continue albuterol HFA, 1-2 inhalations every 4-6 hours as needed.  Subjective and objective measures of pulmonary function will be followed and the treatment plan will be adjusted accordingly.  Allergic rhinitis  Continue appropriate aeroallergen avoidance measures and fluticasone nasal spray as needed.    Meds ordered this encounter  Medications  . fluticasone furoate-vilanterol (BREO ELLIPTA) 200-25 MCG/INH AEPB    Sig: Inhale 1 puff into the lungs daily.    Dispense:  30 each    Refill:  3  . Albuterol Sulfate (PROAIR RESPICLICK) 108 (90 Base) MCG/ACT AEPB    Sig: Inhale 2 puffs into the lungs every 4 (four) hours. As needed for coughing or wheezing.    Dispense:  1 each    Refill:  1    Place RX on hold until patient needs filled.    Diagnositics: Spirometry:  Normal with an FEV1 of  105% predicted.  Please see scanned spirometry results for details.    Physical examination: Blood pressure 126/68, pulse 70, temperature 98.1 F (36.7 C), temperature source Oral, resp. rate 16, SpO2 98 %.  General: Alert, interactive, in no acute distress. HEENT: TMs pearly gray, turbinates mildly edematous without discharge, post-pharynx mildly erythematous. Neck: Supple without lymphadenopathy. Lungs: Clear to auscultation without wheezing, rhonchi or rales. CV: Normal S1, S2 without murmurs. Skin: Warm and dry, without lesions or rashes.  The following portions of the patient's history were reviewed and updated as appropriate: allergies, current medications, past family history, past medical history, past social history, past surgical history and problem list.    Medication List       This list is accurate as of: 12/14/15  8:23 PM.  Always use your most recent med list.               Albuterol Sulfate 108 (90 Base) MCG/ACT Aepb  Commonly known as:  PROAIR RESPICLICK  Inhale 2 puffs into the lungs every 4 (four) hours. As needed for coughing or wheezing.     amphetamine-dextroamphetamine 20 MG tablet  Commonly known as:  ADDERALL  Take 1 tablet (20 mg total) by mouth 2 (two) times daily.     fluticasone 50 MCG/ACT nasal spray  Commonly known as:  FLONASE  Place 2 sprays into both nostrils daily.     fluticasone furoate-vilanterol 100-25 MCG/INH Aepb  Commonly known as:  BREO ELLIPTA  Inhale 1 puff into the lungs daily.     fluticasone furoate-vilanterol 200-25 MCG/INH Aepb  Commonly known as:  BREO ELLIPTA  Inhale 1 puff into the lungs daily.     Fluticasone-Salmeterol 100-50 MCG/DOSE Aepb  Commonly known as:  ADVAIR  Inhale 1 puff into the lungs 2 (two) times daily. Reported on 12/14/2015     SYRINGE 3CC/21GX1" 21G X 1" 3 ML Misc  UAD with testosterone cypionate Rx.     testosterone cypionate 200 MG/ML injection  Commonly known as:  DEPOTESTOSTERONE CYPIONATE    Inject 1.5 mLs (300 mg total) into the muscle every 21 ( twenty-one) days.        No Known Allergies  I appreciate the opportunity to take part in Lonnel's care. Please do not hesitate to contact me with questions.  Sincerely,   R. Jorene Guest, MD

## 2015-12-14 NOTE — Assessment & Plan Note (Signed)
   A sample and prescription have been provided for Breo Ellipta 200/25 g, one inhalation daily. Hold Breo Ellipta 100/25 g for now.  Continue albuterol HFA, 1-2 inhalations every 4-6 hours as needed.  Subjective and objective measures of pulmonary function will be followed and the treatment plan will be adjusted accordingly.

## 2015-12-14 NOTE — Assessment & Plan Note (Signed)
   Continue appropriate aeroallergen avoidance measures and fluticasone nasal spray as needed.

## 2015-12-19 ENCOUNTER — Encounter: Payer: Self-pay | Admitting: Allergy and Immunology

## 2015-12-26 ENCOUNTER — Other Ambulatory Visit: Payer: Self-pay | Admitting: Internal Medicine

## 2016-01-01 ENCOUNTER — Other Ambulatory Visit: Payer: Self-pay | Admitting: Allergy and Immunology

## 2016-04-15 ENCOUNTER — Encounter (INDEPENDENT_AMBULATORY_CARE_PROVIDER_SITE_OTHER): Payer: Self-pay

## 2016-04-15 ENCOUNTER — Ambulatory Visit (INDEPENDENT_AMBULATORY_CARE_PROVIDER_SITE_OTHER): Payer: 59 | Admitting: Allergy and Immunology

## 2016-04-15 ENCOUNTER — Encounter: Payer: Self-pay | Admitting: Allergy and Immunology

## 2016-04-15 VITALS — BP 122/70 | HR 80 | Temp 98.5°F | Resp 16

## 2016-04-15 DIAGNOSIS — J3089 Other allergic rhinitis: Secondary | ICD-10-CM

## 2016-04-15 DIAGNOSIS — J4540 Moderate persistent asthma, uncomplicated: Secondary | ICD-10-CM | POA: Diagnosis not present

## 2016-04-15 NOTE — Assessment & Plan Note (Signed)
Well-controlled on current regimen.  Continue Breo Ellipta 200/25 g, one inhalation daily, and albuterol HFA, 1-2 inhalations every 4-6 hours as needed.

## 2016-04-15 NOTE — Addendum Note (Signed)
Addended by: Illene BolusFIELDS, Stasha Naraine M on: 04/15/2016 04:36 PM   Modules accepted: Orders

## 2016-04-15 NOTE — Assessment & Plan Note (Signed)
   Continue allergen avoidance measures and fluticasone nasal spray as needed.  The patient will return for allergy skin testing in anticipation of initiating immunotherapy.

## 2016-04-15 NOTE — Progress Notes (Signed)
    Follow-up Note  RE: Bob MapleMichael Boteler MRN: [id number] DOB: Nov 07, 1985 Date of Office Visit: 04/15/2016  Primary care provider: Nadean CorwinMCKEOWN,WILLIAM DAVID, MD Referring provider: Lucky CowboyMcKeown, William, MD  History of present illness: Bob [name] is a 30 y.o. male with persistent asthma and allergic rhinitis presenting today for follow up.  He was last seen in this clinic on 12/14/2015.  He reports that his asthma has been well-controlled with Breo 200/25, one inhalation daily.  He had required albuterol rescue somewhat frequently while on the Breo 100/25, but not after having switched to 200/25 g.  His nasal symptoms have been relatively well-controlled with the exception of times that he is exposed to pets. He is interested in the possibility of starting aeroallergen immunotherapy to reduce symptoms and decrease medication requirement.    Assessment and plan: Moderate persistent asthma Well-controlled on current regimen.  Continue Breo Ellipta 200/25 g, one inhalation daily, and albuterol HFA, 1-2 inhalations every 4-6 hours as needed.  Allergic rhinitis  Continue allergen avoidance measures and fluticasone nasal spray as needed.  The patient will return for allergy skin testing in anticipation of initiating immunotherapy.    Diagnostics: Spirometry reveals an FVC of 5.12 L (92% predicted) and an FEV1 of 4.05 L (88% predicted).  Please see scanned spirometry results for details.    Physical examination: Blood pressure 122/70, pulse 80, temperature 98.5 F (36.9 C), temperature source Oral, resp. rate 16, SpO2 97 %.  General: Alert, interactive, in no acute distress. HEENT: TMs pearly gray, turbinates moderately edematous without discharge, post-pharynx mildly erythematous. Neck: Supple without lymphadenopathy. Lungs: Clear to auscultation without wheezing, rhonchi or rales. CV: Normal S1, S2 without murmurs. Skin: Warm and dry, without lesions or rashes.  The following portions of  the patient's history were reviewed and updated as appropriate: allergies, current medications, past family history, past medical history, past social history, past surgical history and problem list.    Medication List       Accurate as of 04/15/16  2:52 PM. Always use your most recent med list.          Albuterol Sulfate 108 (90 Base) MCG/ACT Aepb Commonly known as:  PROAIR RESPICLICK Inhale 2 puffs into the lungs every 4 (four) hours. As needed for coughing or wheezing.   amphetamine-dextroamphetamine 20 MG tablet Commonly known as:  ADDERALL Take 1 tablet (20 mg total) by mouth 2 (two) times daily.   fluticasone 50 MCG/ACT nasal spray Commonly known as:  FLONASE Place 2 sprays into both nostrils daily.   fluticasone furoate-vilanterol 100-25 MCG/INH Aepb Commonly known as:  BREO ELLIPTA Inhale 1 puff into the lungs daily.   fluticasone furoate-vilanterol 200-25 MCG/INH Aepb Commonly known as:  BREO ELLIPTA Inhale 1 puff into the lungs daily.   Fluticasone-Salmeterol 100-50 MCG/DOSE Aepb Commonly known as:  ADVAIR Inhale 1 puff into the lungs 2 (two) times daily. Reported on 12/14/2015   SYRINGE 3CC/21GX1" 21G X 1" 3 ML Misc UAD with testosterone cypionate Rx.   testosterone cypionate 200 MG/ML injection Commonly known as:  DEPOTESTOSTERONE CYPIONATE Inject 1.5 mLs (300 mg total) into the muscle every 21 ( twenty-one) days.       No Known Allergies  I appreciate the opportunity to take part in Mylez's care. Please do not hesitate to contact me with questions.  Sincerely,   R. Jorene Guestarter Denajah Farias, MD

## 2016-04-15 NOTE — Patient Instructions (Signed)
Moderate persistent asthma Well-controlled on current regimen.  Continue Breo Ellipta 200/25 g, one inhalation daily, and albuterol HFA, 1-2 inhalations every 4-6 hours as needed.  Allergic rhinitis  Continue allergen avoidance measures and fluticasone nasal spray as needed.  The patient will return for allergy skin testing in anticipation of initiating immunotherapy.    Return in about 4 months (around 08/13/2016), or if symptoms worsen or fail to improve.

## 2016-05-09 ENCOUNTER — Other Ambulatory Visit: Payer: Self-pay | Admitting: Allergy and Immunology

## 2016-05-09 DIAGNOSIS — J4540 Moderate persistent asthma, uncomplicated: Secondary | ICD-10-CM

## 2016-10-26 ENCOUNTER — Other Ambulatory Visit: Payer: Self-pay | Admitting: Allergy and Immunology

## 2016-10-26 DIAGNOSIS — J4540 Moderate persistent asthma, uncomplicated: Secondary | ICD-10-CM

## 2016-10-29 ENCOUNTER — Other Ambulatory Visit: Payer: Self-pay | Admitting: *Deleted

## 2016-10-29 DIAGNOSIS — J4540 Moderate persistent asthma, uncomplicated: Secondary | ICD-10-CM

## 2016-10-29 MED ORDER — FLUTICASONE FUROATE-VILANTEROL 200-25 MCG/INH IN AEPB: 1.0000 | INHALATION_SPRAY | Freq: Every day | RESPIRATORY_TRACT | 0 refills | Status: DC

## 2016-10-29 MED ORDER — ALBUTEROL SULFATE HFA 108 (90 BASE) MCG/ACT IN AERS: 2.0000 | INHALATION_SPRAY | RESPIRATORY_TRACT | 0 refills | Status: DC | PRN

## 2016-11-12 ENCOUNTER — Ambulatory Visit (INDEPENDENT_AMBULATORY_CARE_PROVIDER_SITE_OTHER): Payer: 59 | Admitting: Allergy and Immunology

## 2016-11-12 ENCOUNTER — Encounter: Payer: Self-pay | Admitting: Allergy and Immunology

## 2016-11-12 VITALS — BP 126/96 | HR 100 | Temp 98.1°F | Resp 20 | Ht 70.5 in | Wt 286.0 lb

## 2016-11-12 DIAGNOSIS — J4540 Moderate persistent asthma, uncomplicated: Secondary | ICD-10-CM | POA: Diagnosis not present

## 2016-11-12 DIAGNOSIS — R030 Elevated blood-pressure reading, without diagnosis of hypertension: Secondary | ICD-10-CM | POA: Diagnosis not present

## 2016-11-12 DIAGNOSIS — J3089 Other allergic rhinitis: Secondary | ICD-10-CM

## 2016-11-12 NOTE — Assessment & Plan Note (Signed)
   The patient has been made aware of the elevated blood pressure readings and has been encouraged to follow up with a primary care physician in the near future regarding this issue.  Bob [name] has verbalized understanding.

## 2016-11-12 NOTE — Assessment & Plan Note (Signed)
   Continue allergen avoidance measures and fluticasone nasal spray as needed.  If allergen avoidance measures and medications fail to adequately relieve symptoms, aeroallergen immunotherapy will be considered.

## 2016-11-12 NOTE — Assessment & Plan Note (Signed)
Well-controlled currently.  Continue Breo Ellipta 200/25 g, one inhalation daily, and albuterol HFA, 1-2 inhalations every 4-6 hours as needed.  Subjective and objective measures of pulmonary function will be followed and the treatment plan will be adjusted accordingly.

## 2016-11-12 NOTE — Progress Notes (Signed)
    Follow-up Note  RE: Bob MapleMichael [name] MRN: [id number] DOB: Oct 06, 1985 Date of Office Visit: 11/12/2016  Primary care provider: Lucky CowboyMcKeown, William, MD Referring provider: Lucky CowboyMcKeown, William, MD  History of present illness: Bob MapleMichael [name] is a 31 y.o. male with persistent asthma and allergic rhinitis presenting today for follow up.  He was last seen in this clinic in November 2017.  He reports that he rarely requires albuterol rescue and denies nocturnal symptoms.  He notes that his wife has commented on some increased snoring at night, though he admits that he has not been using his fluticasone nasal spray.  His wife has not detected apneic periods.   Assessment and plan: Moderate persistent asthma Well-controlled currently.  Continue Breo Ellipta 200/25 g, one inhalation daily, and albuterol HFA, 1-2 inhalations every 4-6 hours as needed.  Subjective and objective measures of pulmonary function will be followed and the treatment plan will be adjusted accordingly.  Allergic rhinitis  Continue allergen avoidance measures and fluticasone nasal spray as needed.  If allergen avoidance measures and medications fail to adequately relieve symptoms, aeroallergen immunotherapy will be considered.  Elevated blood-pressure reading without diagnosis of hypertension  The patient has been made aware of the elevated blood pressure readings and has been encouraged to follow up with a primary care physician in the near future regarding this issue.  Bob [name] has verbalized understanding.   Diagnostics: Spirometry:  Normal with an FEV1 of 93% predicted.  Please see scanned spirometry results for details.    Physical examination: Blood pressure (!) 126/96, pulse 100, temperature 98.1 F (36.7 C), resp. rate 20, height 5' 10.5" (1.791 m), weight 286 lb (129.7 kg), SpO2 97 %.  General: Alert, interactive, in no acute distress. HEENT: TMs pearly gray, turbinates mildly edematous without discharge,  post-pharynx unremarkable. Neck: Supple without lymphadenopathy. Lungs: Clear to auscultation without wheezing, rhonchi or rales. CV: Normal S1, S2 without murmurs. Skin: Warm and dry, without lesions or rashes.  The following portions of the patient's history were reviewed and updated as appropriate: allergies, current medications, past family history, past medical history, past social history, past surgical history and problem list.  Allergies as of 11/12/2016   No Known Allergies     Medication List       Accurate as of 11/12/16  1:21 PM. Always use your most recent med list.          fluticasone furoate-vilanterol 200-25 MCG/INH Aepb Commonly known as:  BREO ELLIPTA Inhale 1 puff into the lungs daily.       No Known Allergies  I appreciate the opportunity to take part in Bob [name]'s care. Please do not hesitate to contact me with questions.  Sincerely,   R. Jorene Guestarter Isauro Skelley, MD

## 2016-11-12 NOTE — Patient Instructions (Signed)
Moderate persistent asthma Well-controlled currently.  Continue Breo Ellipta 200/25 g, one inhalation daily, and albuterol HFA, 1-2 inhalations every 4-6 hours as needed.  Subjective and objective measures of pulmonary function will be followed and the treatment plan will be adjusted accordingly.  Allergic rhinitis  Continue allergen avoidance measures and fluticasone nasal spray as needed.  If allergen avoidance measures and medications fail to adequately relieve symptoms, aeroallergen immunotherapy will be considered.  Elevated blood-pressure reading without diagnosis of hypertension  The patient has been made aware of the elevated blood pressure readings and has been encouraged to follow up with a primary care physician in the near future regarding this issue.  Bob NeedleMichael has verbalized understanding.   Return in about 5 months (around 04/14/2017), or if symptoms worsen or fail to improve.

## 2016-11-25 ENCOUNTER — Other Ambulatory Visit: Payer: Self-pay | Admitting: Allergy and Immunology

## 2016-11-25 DIAGNOSIS — J4540 Moderate persistent asthma, uncomplicated: Secondary | ICD-10-CM

## 2016-11-30 ENCOUNTER — Other Ambulatory Visit: Payer: Self-pay | Admitting: Allergy and Immunology

## 2016-11-30 DIAGNOSIS — J4540 Moderate persistent asthma, uncomplicated: Secondary | ICD-10-CM

## 2017-03-17 ENCOUNTER — Other Ambulatory Visit: Payer: Self-pay | Admitting: Allergy and Immunology

## 2017-03-17 ENCOUNTER — Ambulatory Visit: Payer: 59 | Admitting: Allergy and Immunology

## 2017-03-17 DIAGNOSIS — J4540 Moderate persistent asthma, uncomplicated: Secondary | ICD-10-CM

## 2017-03-17 MED ORDER — FLUTICASONE FUROATE-VILANTEROL 200-25 MCG/INH IN AEPB: 1.0000 | INHALATION_SPRAY | Freq: Every day | RESPIRATORY_TRACT | 0 refills | Status: DC

## 2017-03-17 MED ORDER — ALBUTEROL SULFATE HFA 108 (90 BASE) MCG/ACT IN AERS: 2.0000 | INHALATION_SPRAY | Freq: Four times a day (QID) | RESPIRATORY_TRACT | 0 refills | Status: DC | PRN

## 2017-03-17 NOTE — Telephone Encounter (Signed)
Patient's wife called and had to move his appointment because of weather; he is out of town working. She moved his appointment to 04-01-17. She would like to have some prescriptions called in for him. Breo and his rescue inhaler albuterol.[facility].

## 2017-03-17 NOTE — Telephone Encounter (Signed)
rx sent in number in system is giving a busy tone

## 2017-03-18 ENCOUNTER — Ambulatory Visit: Payer: 59 | Admitting: Allergy and Immunology

## 2017-04-01 ENCOUNTER — Ambulatory Visit (INDEPENDENT_AMBULATORY_CARE_PROVIDER_SITE_OTHER): Payer: 59 | Admitting: Allergy and Immunology

## 2017-04-01 ENCOUNTER — Encounter: Payer: Self-pay | Admitting: Allergy and Immunology

## 2017-04-01 VITALS — BP 138/98 | HR 80 | Resp 18

## 2017-04-01 DIAGNOSIS — J3089 Other allergic rhinitis: Secondary | ICD-10-CM | POA: Diagnosis not present

## 2017-04-01 DIAGNOSIS — R030 Elevated blood-pressure reading, without diagnosis of hypertension: Secondary | ICD-10-CM

## 2017-04-01 DIAGNOSIS — J4540 Moderate persistent asthma, uncomplicated: Secondary | ICD-10-CM | POA: Diagnosis not present

## 2017-04-01 NOTE — Assessment & Plan Note (Signed)
   The patient is aware of the elevated blood pressure readings and is followed by his primary care physician regarding this issue.  He is initiating an exercise and weight loss program.

## 2017-04-01 NOTE — Patient Instructions (Signed)
Moderate persistent asthma Well-controlled currently.  Continue Breo Ellipta 200/25 g, one inhalation daily, and albuterol HFA, 1-2 inhalations every 4-6 hours as needed.  Subjective and objective measures of pulmonary function will be followed and the treatment plan will be adjusted accordingly.  Allergic rhinitis Stable.  Continue allergen avoidance measures and fluticasone nasal spray as needed.  If allergen avoidance measures and medications fail to adequately relieve symptoms, aeroallergen immunotherapy will be considered.  Elevated blood-pressure reading without diagnosis of hypertension  The patient is aware of the elevated blood pressure readings and is followed by his primary care physician regarding this issue.  He is initiating an exercise and weight loss program.   Return in about 5 months (around 08/30/2017), or if symptoms worsen or fail to improve.

## 2017-04-01 NOTE — Assessment & Plan Note (Signed)
Stable.  Continue allergen avoidance measures and fluticasone nasal spray as needed.  If allergen avoidance measures and medications fail to adequately relieve symptoms, aeroallergen immunotherapy will be considered.

## 2017-04-01 NOTE — Progress Notes (Signed)
    Follow-up Note  RE: Bob [name] MRN: [id number] DOB: 1985/08/05 Date of Office Visit: 04/01/2017  Primary care provider: Lucky CowboyMcKeown, William, MD Referring provider: Lucky CowboyMcKeown, William, MD  History of present illness: Bob [name] is a 31 y.o. male with persistent asthma and allergic rhinitis presenting today for follow-up.  He reports that in the interval since his previous visit his upper and lower respiratory symptoms have been well controlled.  He requires albuterol rescue 1 or 2 times per month and denies nocturnal awakenings due to lower respiratory symptoms, typically if he is exposed to dogs or cats.  He is currently taking Brio Ellipta 200-25 g, 1 inhalation daily.  He has no nasal symptoms complaints today.  He has been in discussions with his primary care physician because of elevated blood pressure, however he plans to lose 65 pounds over this next year.    Assessment and plan: Moderate persistent asthma Well-controlled currently.  Continue Breo Ellipta 200/25 g, one inhalation daily, and albuterol HFA, 1-2 inhalations every 4-6 hours as needed.  Subjective and objective measures of pulmonary function will be followed and the treatment plan will be adjusted accordingly.  Allergic rhinitis Stable.  Continue allergen avoidance measures and fluticasone nasal spray as needed.  If allergen avoidance measures and medications fail to adequately relieve symptoms, aeroallergen immunotherapy will be considered.  Elevated blood-pressure reading without diagnosis of hypertension  The patient is aware of the elevated blood pressure readings and is followed by his primary care physician regarding this issue.  He is initiating an exercise and weight loss program.   No orders of the defined types were placed in this encounter.   Diagnostics: Spirometry:  Normal with an FEV1 of 100% predicted. This study was performed while the patient was asymptomatic.  Please see scanned  spirometry results for details.    Physical examination: Blood pressure (!) 138/98, pulse 80, resp. rate 18, SpO2 97 %.  General: Alert, interactive, in no acute distress. HEENT: TMs pearly gray, turbinates mildly edematous without discharge, post-pharynx unremarkable. Neck: Supple without lymphadenopathy. Lungs: Clear to auscultation without wheezing, rhonchi or rales. CV: Normal S1, S2 without murmurs. Skin: Warm and dry, without lesions or rashes.  The following portions of the patient's history were reviewed and updated as appropriate: allergies, current medications, past family history, past medical history, past social history, past surgical history and problem list.  Allergies as of 04/01/2017   No Known Allergies     Medication List       Accurate as of 04/01/17  1:43 PM. Always use your most recent med list.          albuterol 108 (90 Base) MCG/ACT inhaler Commonly known as:  PROVENTIL HFA;VENTOLIN HFA Inhale 2 puffs into the lungs every 6 (six) hours as needed for wheezing or shortness of breath.   fluticasone furoate-vilanterol 200-25 MCG/INH Aepb Commonly known as:  BREO ELLIPTA Inhale 1 puff into the lungs daily.       No Known Allergies  I appreciate the opportunity to take part in Kelson's care. Please do not hesitate to contact me with questions.  Sincerely,   R. Jorene Guestarter Prentice Sackrider, MD

## 2017-04-01 NOTE — Assessment & Plan Note (Signed)
Well-controlled currently.  Continue Breo Ellipta 200/25 g, one inhalation daily, and albuterol HFA, 1-2 inhalations every 4-6 hours as needed.  Subjective and objective measures of pulmonary function will be followed and the treatment plan will be adjusted accordingly.

## 2017-08-25 ENCOUNTER — Other Ambulatory Visit: Payer: Self-pay | Admitting: Allergy and Immunology

## 2017-09-21 ENCOUNTER — Other Ambulatory Visit: Payer: Self-pay | Admitting: Allergy and Immunology

## 2017-11-01 ENCOUNTER — Other Ambulatory Visit: Payer: Self-pay | Admitting: Allergy and Immunology

## 2017-11-01 DIAGNOSIS — J4540 Moderate persistent asthma, uncomplicated: Secondary | ICD-10-CM

## 2017-11-03 NOTE — Telephone Encounter (Signed)
Courtesy refill  

## 2017-12-04 ENCOUNTER — Other Ambulatory Visit: Payer: Self-pay | Admitting: Allergy and Immunology

## 2017-12-04 DIAGNOSIS — J4540 Moderate persistent asthma, uncomplicated: Secondary | ICD-10-CM

## 2017-12-23 ENCOUNTER — Ambulatory Visit: Payer: 59 | Admitting: Allergy and Immunology

## 2017-12-23 ENCOUNTER — Encounter: Payer: Self-pay | Admitting: Allergy and Immunology

## 2017-12-23 VITALS — BP 142/84 | HR 74 | Temp 98.0°F | Resp 20

## 2017-12-23 DIAGNOSIS — J3089 Other allergic rhinitis: Secondary | ICD-10-CM | POA: Diagnosis not present

## 2017-12-23 DIAGNOSIS — J4540 Moderate persistent asthma, uncomplicated: Secondary | ICD-10-CM | POA: Diagnosis not present

## 2017-12-23 MED ORDER — ALBUTEROL SULFATE HFA 108 (90 BASE) MCG/ACT IN AERS: 1.0000 | INHALATION_SPRAY | RESPIRATORY_TRACT | 0 refills | Status: DC | PRN

## 2017-12-23 MED ORDER — CARBINOXAMINE MALEATE 4 MG PO TABS: 4.0000 mg | ORAL_TABLET | Freq: Three times a day (TID) | ORAL | 2 refills | Status: DC | PRN

## 2017-12-23 MED ORDER — FLUTICASONE FUROATE-VILANTEROL 200-25 MCG/INH IN AEPB: 1.0000 | INHALATION_SPRAY | Freq: Every day | RESPIRATORY_TRACT | 0 refills | Status: DC

## 2017-12-23 NOTE — Progress Notes (Signed)
Follow-up Note  RE: Billy Turvey MRN: [id number] DOB: 1985/10/15 Date of Office Visit: 12/23/2017  Primary care provider: Lucky Cowboy, MD Referring provider: Lucky Cowboy, MD  History of present illness: Markise Haymer is a 32 y.o. male persistent asthma and allergic rhinitis presenting today for follow-up.  He was last seen in this clinic in October 2018.  He reports that in the interval since his previous visit he has rarely required albuterol rescue, denies nocturnal awakenings due to lower respiratory symptoms, and denies limitations in normal daily activities.  His nasal allergy symptoms have been well controlled.  He has recently been coughing expectorating thick mucus in the morning, however once the mucus is expectorated the cough resolves.  He needs a refill for Brio Ellipta 200-25 g and albuterol HFA.  Assessment and plan: Moderate persistent asthma Well-controlled currently.  Continue Breo Ellipta 200/25 g, one inhalation daily, and albuterol HFA, 1-2 inhalations every 4-6 hours as needed.  Refill prescriptions have been provided.  Subjective and objective measures of pulmonary function will be followed and the treatment plan will be adjusted accordingly.  Allergic rhinitis  Continue appropriate allergen avoidance measures and fluticasone nasal spray as needed.  Nasal saline spray (i.e., Simply Saline) or nasal saline lavage (i.e., NeilMed) is recommended as needed and prior to medicated nasal sprays.  A prescription has been provided for carbinoxamine 4 mg every 6-8 hours if needed.   Meds ordered this encounter  Medications  . fluticasone furoate-vilanterol (BREO ELLIPTA) 200-25 MCG/INH AEPB    Sig: Inhale 1 puff into the lungs daily.    Dispense:  30 each    Refill:  0    Patient must have office visit for further refills.  Marland Kitchen albuterol (PROVENTIL HFA;VENTOLIN HFA) 108 (90 Base) MCG/ACT inhaler    Sig: Inhale 1-2 puffs into the lungs every 4 (four)  hours as needed for wheezing or shortness of breath.    Dispense:  8.5 Inhaler    Refill:  0    Last fill needs appt  . Carbinoxamine Maleate 4 MG TABS    Sig: Take 1 tablet (4 mg total) by mouth 3 (three) times daily as needed.    Dispense:  60 each    Refill:  2    Diagnostics: Spirometry:  Normal with an FEV1 of 101% predicted.  Please see scanned spirometry results for details.  Physical examination: Blood pressure (!) 142/84, pulse 74, temperature 98 F (36.7 C), temperature source Oral, resp. rate 20, SpO2 96 %.  General: Alert, interactive, in no acute distress. HEENT: TMs pearly gray, turbinates mildly edematous without discharge, post-pharynx unremarkable. Neck: Supple without lymphadenopathy. Lungs: Clear to auscultation without wheezing, rhonchi or rales. CV: Normal S1, S2 without murmurs. Skin: Warm and dry, without lesions or rashes.  The following portions of the patient's history were reviewed and updated as appropriate: allergies, current medications, past family history, past medical history, past social history, past surgical history and problem list.  Allergies as of 12/23/2017   No Known Allergies     Medication List        Accurate as of 12/23/17  7:30 PM. Always use your most recent med list.          albuterol 108 (90 Base) MCG/ACT inhaler Commonly known as:  PROVENTIL HFA;VENTOLIN HFA Inhale 1-2 puffs into the lungs every 4 (four) hours as needed for wheezing or shortness of breath.   Carbinoxamine Maleate 4 MG Tabs Take 1 tablet (4 mg total) by mouth  3 (three) times daily as needed.   fluticasone furoate-vilanterol 200-25 MCG/INH Aepb Commonly known as:  BREO ELLIPTA Inhale 1 puff into the lungs daily.       No Known Allergies  I appreciate the opportunity to take part in Eliaz's care. Please do not hesitate to contact me with questions.  Sincerely,   R. Jorene Guestarter Meriel Kelliher, MD

## 2017-12-23 NOTE — Assessment & Plan Note (Signed)
   Continue appropriate allergen avoidance measures and fluticasone nasal spray as needed.  Nasal saline spray (i.e., Simply Saline) or nasal saline lavage (i.e., NeilMed) is recommended as needed and prior to medicated nasal sprays.  A prescription has been provided for carbinoxamine 4 mg every 6-8 hours if needed.

## 2017-12-23 NOTE — Assessment & Plan Note (Signed)
Well-controlled currently.  Continue Breo Ellipta 200/25 g, one inhalation daily, and albuterol HFA, 1-2 inhalations every 4-6 hours as needed.  Refill prescriptions have been provided.  Subjective and objective measures of pulmonary function will be followed and the treatment plan will be adjusted accordingly.

## 2017-12-23 NOTE — Patient Instructions (Signed)
Moderate persistent asthma Well-controlled currently.  Continue Breo Ellipta 200/25 g, one inhalation daily, and albuterol HFA, 1-2 inhalations every 4-6 hours as needed.  Refill prescriptions have been provided.  Subjective and objective measures of pulmonary function will be followed and the treatment plan will be adjusted accordingly.  Allergic rhinitis  Continue appropriate allergen avoidance measures and fluticasone nasal spray as needed.  Nasal saline spray (i.e., Simply Saline) or nasal saline lavage (i.e., NeilMed) is recommended as needed and prior to medicated nasal sprays.  A prescription has been provided for carbinoxamine 4 mg every 6-8 hours if needed.   Return in about 5 months (around 05/25/2018), or if symptoms worsen or fail to improve.

## 2018-01-15 ENCOUNTER — Other Ambulatory Visit: Payer: Self-pay | Admitting: Allergy and Immunology

## 2018-02-06 ENCOUNTER — Encounter (INDEPENDENT_AMBULATORY_CARE_PROVIDER_SITE_OTHER): Payer: Self-pay | Admitting: Orthopaedic Surgery

## 2018-02-06 ENCOUNTER — Ambulatory Visit (INDEPENDENT_AMBULATORY_CARE_PROVIDER_SITE_OTHER): Payer: Self-pay

## 2018-02-06 ENCOUNTER — Telehealth (INDEPENDENT_AMBULATORY_CARE_PROVIDER_SITE_OTHER): Payer: Self-pay

## 2018-02-06 ENCOUNTER — Ambulatory Visit (INDEPENDENT_AMBULATORY_CARE_PROVIDER_SITE_OTHER): Payer: 59 | Admitting: Orthopaedic Surgery

## 2018-02-06 VITALS — BP 140/82 | HR 93 | Ht 72.0 in | Wt 278.0 lb

## 2018-02-06 DIAGNOSIS — M79671 Pain in right foot: Secondary | ICD-10-CM | POA: Diagnosis not present

## 2018-02-06 NOTE — Telephone Encounter (Signed)
Called and left a VM for patient to return my call concerning an appointment with Dr. Ophelia Charter this afternoon.

## 2018-02-08 LAB — URIC ACID: Uric Acid, Serum: 9.5 mg/dL — ABNORMAL HIGH (ref 4.0–8.0)

## 2018-02-08 LAB — CBC
HCT: 40.5 % (ref 38.5–50.0)
HEMOGLOBIN: 13.9 g/dL (ref 13.2–17.1)
MCH: 30.6 pg (ref 27.0–33.0)
MCHC: 34.3 g/dL (ref 32.0–36.0)
MCV: 89.2 fL (ref 80.0–100.0)
MPV: 10 fL (ref 7.5–12.5)
Platelets: 341 10*3/uL (ref 140–400)
RBC: 4.54 10*6/uL (ref 4.20–5.80)
RDW: 12.7 % (ref 11.0–15.0)
WBC: 8.5 10*3/uL (ref 3.8–10.8)

## 2018-02-08 LAB — SEDIMENTATION RATE: SED RATE: 6 mm/h (ref 0–15)

## 2018-02-08 LAB — ANA: ANA: NEGATIVE

## 2018-02-08 LAB — RHEUMATOID FACTOR

## 2018-02-09 ENCOUNTER — Other Ambulatory Visit: Payer: Self-pay | Admitting: Allergy and Immunology

## 2018-02-14 ENCOUNTER — Other Ambulatory Visit: Payer: Self-pay | Admitting: Allergy and Immunology

## 2018-02-16 ENCOUNTER — Encounter (INDEPENDENT_AMBULATORY_CARE_PROVIDER_SITE_OTHER): Payer: Self-pay | Admitting: Orthopaedic Surgery

## 2018-02-16 NOTE — Progress Notes (Signed)
Office Visit Note   Patient: Bob [name]           Date of Birth: 05/04/1986           MRN: [id number] Visit Date: 02/06/2018              Requested by: Unk Pinto, MD [address] [place], [place] [id number] PCP: Unk Pinto, MD   Assessment & Plan: Visit Diagnoses:  1. Pain in right foot     Plan: We will have him take anti-inflammatories he can get some blue super feet inserts for his shoes.  We discussed the likelihood that he is had an old Lisfranc injury and has bilateral pes planus.  Will obtain some rheumatologic labs and call him with the results.  He may have hyperuricemia.  Follow-Up Instructions: No follow-ups on file.   Orders:  Orders Placed This Encounter  Procedures  . XR Foot Complete Right  . CBC  . Uric acid  . Rheumatoid Factor  . Antinuclear Antib (ANA)  . Sed Rate (ESR)   No orders of the defined types were placed in this encounter.     Procedures: No procedures performed   Clinical Data: No additional findings.   Subjective: Chief Complaint  Patient presents with  . 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  He said difficulty ambulating and had past history of injury to his foot when he played football in high school.  No history of rheumatologic conditions he has had problems with lumbar spondylolysis and low back pain.  Review of Systems positive for bilateral pes planus, asthma, obesity, previous knee injury otherwise negative as pertains HPI.   Objective: Vital Signs: BP 140/82   Pulse 93   Ht 6' (1.829 m)   Wt 278 lb (126.1 kg)   BMI 37.70 kg/m   Physical Exam  Constitutional: He is oriented to person, place, and time. He appears well-developed and well-nourished.  HENT:  Head: Normocephalic and atraumatic.  Eyes: Pupils are equal, round, and reactive to light. EOM are normal.  Neck: No tracheal deviation present. No  thyromegaly present.  Cardiovascular: Normal rate.  Pulmonary/Chest: Effort normal. He has no wheezes.  Abdominal: Soft. Bowel sounds are normal.  Neurological: He is alert and oriented to person, place, and time.  Skin: Skin is warm and dry. Capillary refill takes less than 2 seconds.  Psychiatric: He has a normal mood and affect. His behavior is normal. Judgment and thought content normal.    Ortho Exam bilateral pes planus with hindfoot valgus.  He has tenderness much more on the right than the left at the Lisfranc joint.  Normal forefoot motion pulses are normal no plantar foot lesions.  No plantar calluses.  Negative for Morton's neuroma on exam.  Posterior tibial peroneal's quads gastroc anterior tib EHL are strong.  Specialty Comments:  No specialty comments available.  Imaging: No results found.   PMFS History: Patient Active Problem List   Diagnosis Date Noted  . Elevated blood-pressure reading without diagnosis of hypertension 11/12/2016  . Moderate persistent asthma 04/04/2015  . Allergic rhinitis 04/04/2015  . Medication management 11/02/2014  . Vitamin D deficiency 11/02/2014  . Obesity 11/02/2014  . Bilateral foot pain 11/13/2011  . Gait abnormality 11/13/2011   Past Medical History:  Diagnosis Date  . Asthma     Family History  Problem Relation Age of Onset  . Allergic rhinitis Sister   .  Asthma Sister   . Angioedema Neg Hx   . Eczema Neg Hx   . Immunodeficiency Neg Hx   . Urticaria Neg Hx     Past Surgical History:  Procedure Laterality Date  . KNEE SURGERY    . Left wrist surgery Left 05/14/2014   Social History   Occupational History  . Not on file  Tobacco Use  . Smoking status: Never Smoker  . Smokeless tobacco: Current User    Types: Chew  Substance and Sexual Activity  . Alcohol use: Yes    Alcohol/week: 5.0 standard drinks    Types: 5 Standard drinks or equivalent per week    Comment: social  . Drug use: No  . Sexual activity: Not  on file

## 2018-05-09 ENCOUNTER — Other Ambulatory Visit: Payer: Self-pay | Admitting: Allergy and Immunology

## 2018-05-11 NOTE — Telephone Encounter (Signed)
Courtesy refill  

## 2018-05-19 ENCOUNTER — Ambulatory Visit: Payer: 59 | Admitting: Allergy and Immunology

## 2018-06-10 ENCOUNTER — Other Ambulatory Visit: Payer: Self-pay | Admitting: Allergy and Immunology

## 2018-06-11 ENCOUNTER — Other Ambulatory Visit: Payer: Self-pay | Admitting: Allergy and Immunology

## 2018-06-11 NOTE — Telephone Encounter (Signed)
Courtesy refill  

## 2018-06-29 ENCOUNTER — Ambulatory Visit: Payer: 59 | Admitting: Allergy and Immunology

## 2018-07-03 ENCOUNTER — Ambulatory Visit: Payer: 59 | Admitting: Family Medicine

## 2018-07-03 NOTE — Progress Notes (Deleted)
   [address] [place] [address] Dept: [phone]  FOLLOW UP NOTE  Patient ID: Bob [name], male    DOB: 07-03-1985  Age: 33 y.o. MRN: [id number] Date of Office Visit: 07/03/2018  Assessment  Chief Complaint: No chief complaint on file.  HPI Bob [name] is 33 year old male who presents to the clinic for a follow up with allergy testing. He was last seen in the clinic on 12/23/2017 by Dr. Nunzio [name] for evaluation of asthma and allergic rhinitis.    Drug Allergies:  No Known Allergies  Physical Exam: There were no vitals taken for this visit.   Physical Exam  Diagnostics:    Assessment and Plan: No diagnosis found.  No orders of the defined types were placed in this encounter.   There are no Patient Instructions on file for this visit.  No follow-ups on file.    Thank you for the opportunity to care for this patient.  Please do not hesitate to contact me with questions.  Bob [name], Bob [name]

## 2018-07-09 ENCOUNTER — Other Ambulatory Visit: Payer: Self-pay | Admitting: Allergy and Immunology

## 2018-07-17 ENCOUNTER — Encounter: Payer: Self-pay | Admitting: Family Medicine

## 2018-07-17 ENCOUNTER — Ambulatory Visit: Payer: 59 | Admitting: Family Medicine

## 2018-07-17 VITALS — BP 130/90 | HR 77 | Resp 16 | Ht 71.5 in | Wt 291.6 lb

## 2018-07-17 DIAGNOSIS — J4540 Moderate persistent asthma, uncomplicated: Secondary | ICD-10-CM

## 2018-07-17 DIAGNOSIS — J3089 Other allergic rhinitis: Secondary | ICD-10-CM

## 2018-07-17 MED ORDER — CARBINOXAMINE MALEATE 4 MG PO TABS: 4.0000 mg | ORAL_TABLET | Freq: Three times a day (TID) | ORAL | 3 refills | Status: DC | PRN

## 2018-07-17 MED ORDER — ALBUTEROL SULFATE HFA 108 (90 BASE) MCG/ACT IN AERS: INHALATION_SPRAY | RESPIRATORY_TRACT | 0 refills | Status: DC

## 2018-07-17 MED ORDER — FLUTICASONE FUROATE-VILANTEROL 200-25 MCG/INH IN AEPB: 1.0000 | INHALATION_SPRAY | Freq: Every day | RESPIRATORY_TRACT | 5 refills | Status: DC

## 2018-07-17 NOTE — Patient Instructions (Addendum)
Moderate persistent asthma, uncomplicated Continue Breo Ellipta 200- 1 puff once a day to prevent cough and wheeze Continue Ventolin 2 puffs every 4 hours as needed for cough or wheeze  Allergic rhinitis Continue Flonase 1-2 sprays in each nostril once a day Consider nasal saline rinses Continue carbinoxamine 4 mg every 6-8 hours as needed for nasal symptoms. Follow up for allergy testing at the next visit  Call us if this treatment plan is not working well for you  Follow up in the clinic in 6 months or sooner if needed

## 2018-07-17 NOTE — Progress Notes (Signed)
[address] [name] [address] Dept: [phone]  FOLLOW UP NOTE  Patient ID: Bob [name], male    DOB: June 30, 1985  Age: 33 y.o. MRN: [id number] Date of Office Visit: 07/17/2018  Assessment  Chief Complaint: Allergy Testing  HPI [name] is a 33 year old male who presents to the clinic for a follow up visit. He was last seen in this clinic on 12/24/2107 by Dr. Nunzio Cobbs for evaluation of asthma and allergic rhinitis. At today's visit, he reports his asthma has been well controlled with no shortness of breath or wheeze. He reports he coughs clear mucus in the morning a few days a week. He is not experiencing nighttime symptoms. He is currently using Breo 200-1 puff once a day and rarely using his albuterol inhaler. Allergic rhinitis is reported as well controlled with no medical intervention at this time. He reports he is not taking carbinoxamine at this time and he refuses to use nasal sprays or rinses. He reports his lifestyle and activities are not impacted by his environmental allergies and is refusing environmental allergy skin testing today. His current medications are listed in the chart.    Drug Allergies:  No Known Allergies  Physical Exam: BP 130/90 (BP Location: Right Arm, Patient Position: Sitting, Cuff Size: Large)   Pulse 77   Resp 16   Ht 5' 11.5" (1.816 m)   Wt 291 lb 9.6 oz (132.3 kg)   BMI 40.10 kg/m    Physical Exam Vitals signs reviewed.  Constitutional:      Appearance: Normal appearance.  HENT:     Head: Normocephalic and atraumatic.     Right Ear: Tympanic membrane normal.     Left Ear: Tympanic membrane normal.     Nose: Nose normal.     Comments: Bilateral nares normal. Pharynx normal. Ears normal. Eyes normal.    Mouth/Throat:     Pharynx: Oropharynx is clear.  Eyes:     Conjunctiva/sclera: Conjunctivae normal.  Neck:     Musculoskeletal: Normal range of motion.  Cardiovascular:     Rate and Rhythm: Normal rate and regular  rhythm.     Heart sounds: Normal heart sounds. No murmur.  Pulmonary:     Effort: Pulmonary effort is normal.     Breath sounds: Normal breath sounds.     Comments: Lungs clear to auscultation Musculoskeletal: Normal range of motion.  Skin:    General: Skin is warm and dry.  Neurological:     Mental Status: He is alert and oriented to person, place, and time.  Psychiatric:        Mood and Affect: Mood normal.        Behavior: Behavior normal.        Thought Content: Thought content normal.        Judgment: Judgment normal.     Diagnostics: FVC 5.11, FEV1 4.29. Predicted FVC 5.61, predicted FEV1 4.55. Spirometry is within the normal range.   Assessment and Plan: 1. Moderate persistent asthma, uncomplicated   2. Allergic rhinitis     Meds ordered this encounter  Medications  . Carbinoxamine Maleate 4 MG TABS    Sig: Take 1 tablet (4 mg total) by mouth 3 (three) times daily as needed.    Dispense:  60 each    Refill:  3  . fluticasone furoate-vilanterol (BREO ELLIPTA) 200-25 MCG/INH AEPB    Sig: Inhale 1 puff into the lungs daily.    Dispense:  1 each    Refill:  5  . albuterol (VENTOLIN HFA) 108 (90 Base) MCG/ACT inhaler    Sig: INHALE 1 TO 2 INHALATIONS EVERY 4 HOURS AS NEEDED FOR WHEEZE OR SHORTNESS OF BREATH    Dispense:  18 Inhaler    Refill:  0    Last fill needs appt    Patient Instructions  Moderate persistent asthma, uncomplicated Continue Breo Ellipta 200- 1 puff once a day to prevent cough and wheeze Continue Ventolin 2 puffs every 4 hours as needed for cough or wheeze  Allergic rhinitis Continue Flonase 1-2 sprays in each nostril once a day Consider nasal saline rinses Continue carbinoxamine 4 mg every 6-8 hours as needed for nasal symptoms. Follow up for allergy testing at the next visit  Call us if this treatment plan is not working well for you  Follow up in the clinic in 6 months or sooner if needed   Return in about 6 months (around  01/15/2019), or if symptoms worsen or fail to improve.    Thank you for the opportunity to care for this patient.  Please do not hesitate to contact me with questions.  [name], FNP [facility]

## 2018-10-28 ENCOUNTER — Ambulatory Visit: Payer: Self-pay

## 2018-10-28 ENCOUNTER — Ambulatory Visit: Payer: 59 | Admitting: Orthopaedic Surgery

## 2018-10-28 ENCOUNTER — Other Ambulatory Visit: Payer: Self-pay

## 2018-10-28 ENCOUNTER — Encounter: Payer: Self-pay | Admitting: Orthopaedic Surgery

## 2018-10-28 VITALS — Ht 71.0 in | Wt 282.0 lb

## 2018-10-28 DIAGNOSIS — M10032 Idiopathic gout, left wrist: Secondary | ICD-10-CM

## 2018-10-28 DIAGNOSIS — M25532 Pain in left wrist: Secondary | ICD-10-CM | POA: Diagnosis not present

## 2018-10-28 MED ORDER — MELOXICAM 7.5 MG PO TABS: 15.0000 mg | ORAL_TABLET | Freq: Every day | ORAL | 4 refills | Status: DC

## 2018-10-28 NOTE — Progress Notes (Signed)
Office Visit Note   Patient: Bob [name]           Date of Birth: 11/28/1985           MRN: [id number] Visit Date: 10/28/2018              Requested by: Unk Pinto, [address] [place], [place] [address] PCP: Unk Pinto, MD   Assessment & Plan: Visit Diagnoses:  1. Pain in left wrist     Plan: Patient did not have an acute reinjury.  He needs to rest and not play golf a few days place him on meloxicam 15 mg daily and will check a be met and uric acid.  This may be related to hyperuricemia.  He has had podagra in the past treated with anti-inflammatories and has not been on allopurinol.  I have a cell phone I will call him with the results of the uric acid.  We discussed possibility of allopurinol treatment if he has persistent hyperuricemia.  He played high school football has several areas where he had some joint problems no recent knee or ankle pain problems but may be having some subclinical gout.  If gout test are negative and he does not respond to anti-inflammatories we can refer him back to the hand surgeon who did his ligament surgery.  Follow-Up Instructions: No follow-ups on file.   Orders:  Orders Placed This Encounter  Procedures  . XR Wrist Complete Left   No orders of the defined types were placed in this encounter.     Procedures: No procedures performed   Clinical Data: No additional findings.   Subjective: Chief Complaint  Patient presents with  . Left Wrist - Pain    HPI 33 year old male with left lateral wrist pain that started Thursday, 10/22/2018 when he was playing golf.  He has been walking and off course working on weight loss and had increased pain Friday and Saturday particularly at night.  He is used Aleve and ibuprofen.  Previous surgery done by hand surgeon I do not have the operative note available.  Past history of podagra with uric acid 9.5 not treated.  He has had some lumbar problems the distant past none  recently no other joint complaints.  Patient denies fever chills he is noticed erythema swelling and tenderness.  Review of Systems bilateral pes planus.  X-ray suggestive of old Lisfranc injury.  Lumbar spondylolysis.  Left wrist tender procedure Corene Cornea the ulnar styloid several years ago.  History of hyperuricemia 9.5 2019.   Objective: Vital Signs: Ht '5\' 11"'  (1.803 m)   Wt 282 lb (127.9 kg)   BMI 39.33 kg/m   Physical Exam Constitutional:      Appearance: He is well-developed.  HENT:     Head: Normocephalic and atraumatic.  Eyes:     Pupils: Pupils are equal, round, and reactive to light.  Neck:     Thyroid: No thyromegaly.     Trachea: No tracheal deviation.  Cardiovascular:     Rate and Rhythm: Normal rate.  Pulmonary:     Effort: Pulmonary effort is normal.     Breath sounds: No wheezing.  Abdominal:     General: Bowel sounds are normal.     Palpations: Abdomen is soft.  Skin:    General: Skin is warm and dry.     Capillary Refill: Capillary refill takes less than 2 seconds.  Neurological:     Mental Status: He is alert and oriented to  person, place, and time.  Psychiatric:        Behavior: Behavior normal.        Thought Content: Thought content normal.        Judgment: Judgment normal.     Ortho Exam patient has erythema some tenderness no cellulitis over the ECU tendon at the ulnar styloid no wrist effusion no snuffbox tenderness finger flexion extension is intact.  Some pain with resisted ECU function no subluxation noted.  Specialty Comments:  No specialty comments available.  Imaging: No results found.   PMFS History: Patient Active Problem List   Diagnosis Date Noted  . Elevated blood-pressure reading without diagnosis of hypertension 11/12/2016  . Moderate persistent asthma, uncomplicated 90/30/0923  . Allergic rhinitis 04/04/2015  . Medication management 11/02/2014  . Vitamin D deficiency 11/02/2014  . Obesity 11/02/2014  . Bilateral foot pain  11/13/2011  . Gait abnormality 11/13/2011   Past Medical History:  Diagnosis Date  . Asthma     Family History  Problem Relation Age of Onset  . Allergic rhinitis Sister   . Asthma Sister   . Angioedema Neg Hx   . Eczema Neg Hx   . Immunodeficiency Neg Hx   . Urticaria Neg Hx     Past Surgical History:  Procedure Laterality Date  . KNEE SURGERY    . Left wrist surgery Left 05/14/2014   Social History   Occupational History  . Not on file  Tobacco Use  . Smoking status: Never Smoker  . Smokeless tobacco: Current User    Types: Chew  Substance and Sexual Activity  . Alcohol use: Yes    Alcohol/week: 5.0 standard drinks    Types: 5 Standard drinks or equivalent per week    Comment: social  . Drug use: No  . Sexual activity: Not on file

## 2018-10-29 LAB — BASIC METABOLIC PANEL
BUN: 14 mg/dL (ref 7–25)
CO2: 26 mmol/L (ref 20–32)
Calcium: 9.5 mg/dL (ref 8.6–10.3)
Chloride: 104 mmol/L (ref 98–110)
Creat: 0.9 mg/dL (ref 0.60–1.35)
Glucose, Bld: 97 mg/dL (ref 65–99)
Potassium: 5 mmol/L (ref 3.5–5.3)
Sodium: 141 mmol/L (ref 135–146)

## 2018-10-29 LAB — URIC ACID: Uric Acid, Serum: 7.9 mg/dL (ref 4.0–8.0)

## 2018-10-29 LAB — EXTRA LAV TOP TUBE

## 2018-10-31 ENCOUNTER — Telehealth: Payer: Self-pay

## 2018-10-31 NOTE — Telephone Encounter (Signed)
vm left for a patient to call back to set up covid testing. uts s/sx for covid following potential exposure.

## 2018-12-06 ENCOUNTER — Encounter (HOSPITAL_COMMUNITY): Payer: Self-pay | Admitting: Emergency Medicine

## 2018-12-06 ENCOUNTER — Emergency Department (HOSPITAL_COMMUNITY): Payer: 59

## 2018-12-06 ENCOUNTER — Emergency Department (HOSPITAL_COMMUNITY)
Admission: EM | Admit: 2018-12-06 | Discharge: 2018-12-07 | Disposition: A | Discharge status: Home / Self Care | Payer: 59 | Attending: Emergency Medicine | Admitting: Emergency Medicine

## 2018-12-06 ENCOUNTER — Other Ambulatory Visit: Payer: Self-pay

## 2018-12-06 DIAGNOSIS — S52392A Other fracture of shaft of radius, left arm, initial encounter for closed fracture: Secondary | ICD-10-CM | POA: Diagnosis not present

## 2018-12-06 DIAGNOSIS — Y999 Unspecified external cause status: Secondary | ICD-10-CM | POA: Insufficient documentation

## 2018-12-06 DIAGNOSIS — Y93H9 Activity, other involving exterior property and land maintenance, building and construction: Secondary | ICD-10-CM | POA: Diagnosis not present

## 2018-12-06 DIAGNOSIS — W1789XA Other fall from one level to another, initial encounter: Secondary | ICD-10-CM | POA: Diagnosis not present

## 2018-12-06 DIAGNOSIS — J45909 Unspecified asthma, uncomplicated: Secondary | ICD-10-CM | POA: Diagnosis not present

## 2018-12-06 DIAGNOSIS — Z79899 Other long term (current) drug therapy: Secondary | ICD-10-CM | POA: Diagnosis not present

## 2018-12-06 DIAGNOSIS — S52302A Unspecified fracture of shaft of left radius, initial encounter for closed fracture: Secondary | ICD-10-CM

## 2018-12-06 DIAGNOSIS — Y92028 Other place in mobile home as the place of occurrence of the external cause: Secondary | ICD-10-CM | POA: Diagnosis not present

## 2018-12-06 DIAGNOSIS — S59912A Unspecified injury of left forearm, initial encounter: Secondary | ICD-10-CM | POA: Diagnosis present

## 2018-12-06 NOTE — ED Triage Notes (Signed)
Pt reports left arm pain. Pt reports he fell and landed on his arm at 9pm tonight. Pt reports it hurts to move his fingers. Pulse and cap refill intact. Ice pack applied, pt in a sling. Pt reports he was covid + 3 weeks ago. Denies sx for the past week.

## 2018-12-07 MED ORDER — OXYCODONE-ACETAMINOPHEN 5-325 MG PO TABS: 2.0000 | ORAL_TABLET | Freq: Three times a day (TID) | ORAL | 0 refills | Status: DC | PRN

## 2018-12-07 MED ORDER — HYDROCODONE-ACETAMINOPHEN 5-325 MG PO TABS
2.0000 | ORAL_TABLET | Freq: Once | ORAL | Status: AC
  Administered 2018-12-07: 02:00:00 2 via ORAL
  Filled 2018-12-07: qty 2

## 2018-12-07 NOTE — ED Provider Notes (Signed)
[facility] Provider Note   CSN: [id number] Arrival date & time: 12/06/18  2213    History   Chief Complaint Chief Complaint  Patient presents with  . Arm Injury    HPI Bob [name] is a 33 y.o. male who presents for evaluation of left upper extremity pain status post mechanical fall that occurred earlier this evening approximately 9 PM.  He reports that he was on a trailer that is about 6 feet high states he fell.  He thinks his arm was outstretched.  He reports that since then, he has had pain to the left arm.  Pain is worse with movement of the upper extremity.  He states initially after the incident, he had some numbness but states that that has since resolved.  He states that he did hit his head but denies any LOC.  He is not currently on blood thinners.  He has not had any nausea/vomiting.  He states that a couple times while getting an x-ray while they were moving his arm, he felt like he was going to pass out but never lost consciousness.  Patient denies any other injury, chest pain, difficulty breathing.     The history is provided by the patient.    Past Medical History:  Diagnosis Date  . Asthma     Patient Active Problem List   Diagnosis Date Noted  . Elevated blood-pressure reading without diagnosis of hypertension 11/12/2016  . Moderate persistent asthma, uncomplicated 04/04/2015  . Allergic rhinitis 04/04/2015  . Medication management 11/02/2014  . Vitamin D deficiency 11/02/2014  . Obesity 11/02/2014  . Bilateral foot pain 11/13/2011  . Gait abnormality 11/13/2011    Past Surgical History:  Procedure Laterality Date  . KNEE SURGERY    . Left wrist surgery Left 05/14/2014        Home Medications    Prior to Admission medications   Medication Sig Start Date End Date Taking? Authorizing Provider  albuterol (VENTOLIN HFA) 108 (90 Base) MCG/ACT inhaler INHALE 1 TO 2 INHALATIONS EVERY 4 HOURS AS NEEDED FOR WHEEZE  OR SHORTNESS OF BREATH 07/17/18   Ambs, Norvel RichardsAnne M, FNP  Carbinoxamine Maleate 4 MG TABS Take 1 tablet (4 mg total) by mouth 3 (three) times daily as needed. 07/17/18   Ambs, Norvel RichardsAnne M, FNP  fluticasone furoate-vilanterol (BREO ELLIPTA) 200-25 MCG/INH AEPB Inhale 1 puff into the lungs daily. 07/17/18   Hetty BlendAmbs, Anne M, FNP  meloxicam (MOBIC) 7.5 MG tablet Take 2 tablets (15 mg total) by mouth daily. 10/28/18   Eldred MangesYates, Mark C, MD  oxyCODONE-acetaminophen (PERCOCET/ROXICET) 5-325 MG tablet Take 2 tablets by mouth every 8 (eight) hours as needed for severe pain. 12/07/18   Maxwell CaulLayden, Melford Tullier A, PA-C    Family History Family History  Problem Relation Age of Onset  . Allergic rhinitis Sister   . Asthma Sister   . Angioedema Neg Hx   . Eczema Neg Hx   . Immunodeficiency Neg Hx   . Urticaria Neg Hx     Social History Social History   Tobacco Use  . Smoking status: Never Smoker  . Smokeless tobacco: Current User    Types: Chew  Substance Use Topics  . Alcohol use: Yes    Alcohol/week: 5.0 standard drinks    Types: 5 Standard drinks or equivalent per week    Comment: social  . Drug use: No     Allergies   Patient has no known allergies.   Review of Systems Review  of Systems  Gastrointestinal: Negative for nausea and vomiting.  Musculoskeletal:       LUE pain  Neurological: Negative for weakness and numbness.  All other systems reviewed and are negative.    Physical Exam Updated Vital Signs BP 130/73 (BP Location: Right Arm)   Pulse 78   Temp 98.5 F (36.9 C) (Oral)   Resp 18   Ht 6' (1.829 m)   Wt 124.7 kg   SpO2 98%   BMI 37.30 kg/m   Physical Exam Vitals signs and nursing note reviewed.  Constitutional:      Appearance: He is well-developed.  HENT:     Head: Normocephalic and atraumatic.   Eyes:     General: No scleral icterus.       Right eye: No discharge.        Left eye: No discharge.     Conjunctiva/sclera: Conjunctivae normal.     Comments: PERRL. EOMs intact.    Neck:     Musculoskeletal: Full passive range of motion without pain.  Cardiovascular:     Pulses:          Radial pulses are 2+ on the right side and 2+ on the left side.  Pulmonary:     Effort: Pulmonary effort is normal.  Musculoskeletal:     Comments: Tenderness palpation noted to left forearm.  Overlying soft tissue swelling but compartments are soft.  Limited range of motion secondary to pain.  Patient able to wiggle all 5 digits of left hand.  He can make a fist and extend his fingers without difficulty.  No bony tenderness noted to the wrist, elbow, shoulder.  No bony tenderness noted to the upper extremity.  Skin:    General: Skin is warm and dry.     Capillary Refill: Capillary refill takes less than 2 seconds.     Comments: Good distal cap refill. LUE is not dusky in appearance or cool to touch.  Neurological:     Mental Status: He is alert.     Comments: Sensation intact along major nerve distributions of BUE Cranial nerves II-XII intact.  5/5 strength BLE and RUE Strength of LUE limited to pain.  Psychiatric:        Speech: Speech normal.        Behavior: Behavior normal.      ED Treatments / Results  Labs (all labs ordered are listed, but only abnormal results are displayed) Labs Reviewed - No data to display  EKG None  Radiology Dg Elbow Complete Left  Result Date: 12/07/2018 CLINICAL DATA:  Left elbow, forearm, and wrist pain after fall while doing yard work. EXAM: LEFT ELBOW - COMPLETE 3+ VIEW COMPARISON:  None. FINDINGS: Proximal radial shaft fracture is seen on forearm radiographs. Slight asymmetric widening angulation of the radiocapitellar joint without frank dislocation. No elbow joint effusion. No additional fracture of the elbow. IMPRESSION: Proximal radial shaft fracture with slight asymmetric widening of the radiocapitellar joint. Electronically Signed   By: Keith Rake M.D.   On: 12/07/2018 00:00   Dg Forearm Left  Result Date: 12/06/2018  CLINICAL DATA:  Left elbow, forearm, and wrist pain after fall while doing yard work. EXAM: LEFT FOREARM - 2 VIEW COMPARISON:  None. FINDINGS: Proximal radial shaft fracture with 3/4 which displacement. No significant angulation. No associated ulna fracture. Soft tissue edema noted at the fracture site. Punctate densities projecting over the adjacent soft tissues may be soft tissue calcifications or radiopaque debris. IMPRESSION: Displaced proximal radial shaft  fracture. Electronically Signed   By: Narda RutherfordMelanie  Sanford M.D.   On: 12/06/2018 23:58   Dg Wrist Complete Left  Result Date: 12/06/2018 CLINICAL DATA:  Left elbow, forearm, and wrist pain after fall while doing yard work. EXAM: LEFT WRIST - COMPLETE 3+ VIEW COMPARISON:  None. FINDINGS: There is no evidence of fracture or dislocation. There is no evidence of arthropathy or other focal bone abnormality. Soft tissues are unremarkable. IMPRESSION: Negative radiographs of the left wrist. Electronically Signed   By: Narda RutherfordMelanie  Sanford M.D.   On: 12/06/2018 23:59   Dg Shoulder Left  Result Date: 12/06/2018 CLINICAL DATA:  Left shoulder pain after fall. Possible dislocation. EXAM: LEFT SHOULDER - 2+ VIEW COMPARISON:  None. FINDINGS: There is no evidence of fracture or dislocation. There is no evidence of arthropathy or other focal bone abnormality. Soft tissues are unremarkable. IMPRESSION: No fracture or dislocation of the left shoulder. Electronically Signed   By: Narda RutherfordMelanie  Sanford M.D.   On: 12/06/2018 22:40    Procedures Procedures (including critical care time)  Medications Ordered in ED Medications  HYDROcodone-acetaminophen (NORCO/VICODIN) 5-325 MG per tablet 2 tablet (2 tablets Oral Given 12/07/18 0158)     Initial Impression / Assessment and Plan / ED Course  I have reviewed the triage vital signs and the nursing notes.  Pertinent labs & imaging results that were available during my care of the patient were reviewed by me and considered in my  medical decision making (see chart for details).        33 year old male who presents for evaluation of right upper extremity pain after mechanical fall off a trailer.  Reports that he has had limited range of motion of arm since then.  Initially had some numbness but that is since resolved. Patient is afebrile, non-toxic appearing, sitting comfortably on examination table. Vital signs reviewed and stable.  Patient is neurovascularly intact.  Did report hitting his head but no LOC.  Not currently on blood thinners.  No nausea/vomiting.  He did state that while getting the x-ray when they were moving his hand, he felt like he was going to pass out secondary to the pain.  No neuro deficits on exam.  Concern for fracture versus dislocation.  X-rays ordered at triage.  X-rays reviewed.  There is a proximal radial shaft fracture with slight asymmetric widening of the radiocapitellar joint.  Wrist and shoulder x-rays negative.  Patient placed in a sugar tong splint.  Reevaluation after splint placement shows patient with good distal sensation and cap refill.  Will give short course of pain medication for acute fracture.  Instructed patient to follow-up with referred orthopedic doctor. At this time, patient exhibits no emergent life-threatening condition that require further evaluation in ED or admission. Patient had ample opportunity for questions and discussion. All patient's questions were answered with full understanding. Strict return precautions discussed. Patient expresses understanding and agreement to plan.   Portions of this note were generated with Scientist, clinical (histocompatibility and immunogenetics)Dragon dictation software. Dictation errors may occur despite best attempts at proofreading.   Final Clinical Impressions(s) / ED Diagnoses   Final diagnoses:  Closed fracture of shaft of left radius, unspecified fracture morphology, initial encounter    ED Discharge Orders         Ordered    oxyCODONE-acetaminophen (PERCOCET/ROXICET) 5-325 MG  tablet  Every 8 hours PRN,   Status:  Discontinued     12/07/18 0243    oxyCODONE-acetaminophen (PERCOCET/ROXICET) 5-325 MG tablet  Every 8 hours PRN  12/07/18 0244           Maxwell CaulLayden, Wren Pryce A, PA-C 12/07/18 0502    Nira Connardama, Pedro Eduardo, MD 12/07/18 [phone]

## 2018-12-07 NOTE — Discharge Instructions (Signed)
You can take Tylenol or Ibuprofen as directed for pain. You can alternate Tylenol and Ibuprofen every 4 hours. If you take Tylenol at 1pm, then you can take Ibuprofen at 5pm. Then you can take Tylenol again at 9pm.   Take pain medications as directed for break through pain. Do not drive or operate machinery while taking this medication.   Follow up with referred orthopedic doctor.   Keep the arm elevated to help with swelling.   Return to the ED for any worsening pain, numbness/weakness of the arms or hands, discoloration of the hands, or worsening pain.

## 2018-12-07 NOTE — ED Notes (Signed)
PT states understanding of care given, follow up care, and medication prescribed. PT ambulated from ED to car with a steady gait. 

## 2018-12-08 ENCOUNTER — Encounter: Payer: Self-pay | Admitting: Orthopaedic Surgery

## 2018-12-08 ENCOUNTER — Other Ambulatory Visit: Payer: Self-pay

## 2018-12-08 ENCOUNTER — Encounter (HOSPITAL_COMMUNITY): Payer: Self-pay | Admitting: *Deleted

## 2018-12-08 ENCOUNTER — Other Ambulatory Visit (HOSPITAL_COMMUNITY)
Admission: RE | Admit: 2018-12-08 | Discharge: 2018-12-08 | Disposition: A | Discharge status: Home / Self Care | Payer: 59 | Source: Ambulatory Visit | Attending: Orthopaedic Surgery | Admitting: Orthopaedic Surgery

## 2018-12-08 ENCOUNTER — Ambulatory Visit (INDEPENDENT_AMBULATORY_CARE_PROVIDER_SITE_OTHER): Payer: 59 | Admitting: Orthopaedic Surgery

## 2018-12-08 DIAGNOSIS — S52302A Unspecified fracture of shaft of left radius, initial encounter for closed fracture: Secondary | ICD-10-CM | POA: Insufficient documentation

## 2018-12-08 DIAGNOSIS — Z01812 Encounter for preprocedural laboratory examination: Secondary | ICD-10-CM | POA: Insufficient documentation

## 2018-12-08 DIAGNOSIS — Z1159 Encounter for screening for other viral diseases: Secondary | ICD-10-CM | POA: Diagnosis not present

## 2018-12-08 DIAGNOSIS — S52322A Displaced transverse fracture of shaft of left radius, initial encounter for closed fracture: Secondary | ICD-10-CM

## 2018-12-08 LAB — SARS CORONAVIRUS 2 (TAT 6-24 HRS): SARS Coronavirus 2: NEGATIVE

## 2018-12-08 MED ORDER — DEXTROSE 5 % IV SOLN
3.0000 g | INTRAVENOUS | Status: AC
  Administered 2018-12-09: 17:00:00 3 g via INTRAVENOUS
  Filled 2018-12-08: qty 3

## 2018-12-08 NOTE — Progress Notes (Signed)
Spoke with pt for pre-op call. Pt denies cardiac history, HTN or Diabetes. Pt states he had Covid 19, tested positive on 11/25/18. States it was a "mild" case. No symptoms for a week now. Had Covid 19 testing done today, results are not back.   Coronavirus Screening  Have you experienced the following symptoms:  Cough NO Fever (>100.40F)  NO Runny nose NO Sore throat NO Difficulty breathing/shortness of breath  NO  Have you or a family member traveled in the last 14 days and where? NO   If the patient indicates "YES" to the above questions, their PAT will be rescheduled to limit the exposure to others and, the surgeon will be notified. THE PATIENT WILL NEED TO BE ASYMPTOMATIC FOR 14 DAYS.   If the patient is not experiencing any of these symptoms, the PAT nurse will instruct them to NOT bring anyone with them to their appointment since they may have these symptoms or traveled as well.   Patient reminded that hospital visitation restrictions are in effect and the importance of the restrictions.

## 2018-12-08 NOTE — Progress Notes (Signed)
Message left for Bob [name] to schedule Covid 19 screening prior to procedure on 12/09/18.

## 2018-12-08 NOTE — Progress Notes (Signed)
Office Visit Note   Patient: Bob [name]           Date of Birth: 10-28-1985           MRN: [id number] Visit Date: 12/08/2018              Requested by: Bob Noon, MD [place],  [place] [phone] PCP: Bob Noon, MD   Assessment & Plan: Visit Diagnoses:  1. Closed displaced transverse fracture of shaft of left radius, initial encounter     Plan: Patient require open reduction internal fixation left radial shaft fracture with plate fixation.  We discussed risks of infection, nonunion nerve injury with problems with either sensation his hands or problems with motion of his fingers.  Questions were elicited answered he understands request to proceed.  Follow-Up Instructions: Return for pre-op .   Orders:  No orders of the defined types were placed in this encounter.  No orders of the defined types were placed in this encounter.     Procedures: No procedures performed   Clinical Data: No additional findings.   Subjective: Chief Complaint  Patient presents with  . 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  Ulna was intact he had immediate severe pain inability to move his arm.  No past history of injury to his left arm other than some some lateral ligament surgery at the wrist.  He was seen in emergency room x-rays were obtained and is placed in a splint.  X-rays of her shoulder were negative.  Review of Systems Positive for bilateral pes planus.  History of old Lisfranc injury lumbar spondylolysis.  Left wrist ligamentous surgery.  History of hyperuricemia.  Otherwise negative is obtains HPI.  Objective: Vital Signs: Ht 5\' 11"  (1.803 m)   Wt 270 lb (122.5 kg)   BMI  37.66 kg/m   Physical Exam Constitutional:      Appearance: He is well-developed.  HENT:     Head: Normocephalic and atraumatic.  Eyes:     Pupils: Pupils are equal, round, and reactive to light.  Neck:     Thyroid: No thyromegaly.     Trachea: No tracheal deviation.  Cardiovascular:     Rate and Rhythm: Normal rate.  Pulmonary:     Effort: Pulmonary effort is normal.     Breath sounds: No wheezing.  Abdominal:     General: Bowel sounds are normal.     Palpations: Abdomen is soft.  Skin:    General: Skin is warm and dry.     Capillary Refill: Capillary refill takes less than 2 seconds.  Neurological:     Mental Status: He is alert and oriented to person, place, and time.  Psychiatric:        Behavior: Behavior normal.        Thought Content: Thought content normal.        Judgment: Judgment normal.     Ortho Exam patient has finger extension also finger flexion intact ulnar median and radial sensory nerve. Pulses normal.  Specialty Comments:  No specialty comments available.  Imaging: CLINICAL DATA:  Left elbow, forearm, and wrist pain after fall while doing yard work.  EXAM:  LEFT FOREARM - 2 VIEW  COMPARISON:  None.  FINDINGS: Proximal radial shaft fracture with 3/4 which displacement. No significant angulation. No associated ulna fracture. Soft tissue edema noted at the fracture site. Punctate densities projecting over the adjacent soft tissues may be soft tissue calcifications or radiopaque debris.  IMPRESSION: Displaced proximal radial shaft fracture.   Electronically Signed   By: Narda RutherfordMelanie  Sanford M.D.   On: 12/06/2018 23:58    PMFS History: Patient Active Problem List   Diagnosis Date Noted  . Fracture of radial shaft, left, closed 12/08/2018  . Elevated blood-pressure reading without diagnosis of hypertension 11/12/2016  . Moderate persistent asthma, uncomplicated 04/04/2015  . Allergic rhinitis 04/04/2015  . Medication management  11/02/2014  . Vitamin D deficiency 11/02/2014  . Obesity 11/02/2014  . Bilateral foot pain 11/13/2011  . Gait abnormality 11/13/2011   Past Medical History:  Diagnosis Date  . Asthma     Family History  Problem Relation Age of Onset  . Allergic rhinitis Sister   . Asthma Sister   . Angioedema Neg Hx   . Eczema Neg Hx   . Immunodeficiency Neg Hx   . Urticaria Neg Hx     Past Surgical History:  Procedure Laterality Date  . KNEE SURGERY    . Left wrist surgery Left 05/14/2014   Social History   Occupational History  . Not on file  Tobacco Use  . Smoking status: Never Smoker  . Smokeless tobacco: Current User    Types: Chew  Substance and Sexual Activity  . Alcohol use: Yes    Alcohol/week: 5.0 standard drinks    Types: 5 Standard drinks or equivalent per week    Comment: social  . Drug use: No  . Sexual activity: Not on file

## 2018-12-09 ENCOUNTER — Ambulatory Visit (HOSPITAL_COMMUNITY): Payer: 59

## 2018-12-09 ENCOUNTER — Ambulatory Visit (HOSPITAL_COMMUNITY)
Admission: RE | Admit: 2018-12-09 | Discharge: 2018-12-09 | Disposition: A | Discharge status: Home / Self Care | Payer: 59 | Attending: Orthopaedic Surgery | Admitting: Orthopaedic Surgery

## 2018-12-09 ENCOUNTER — Ambulatory Visit (HOSPITAL_COMMUNITY): Payer: 59 | Admitting: Certified Registered Nurse Anesthetist

## 2018-12-09 ENCOUNTER — Encounter (HOSPITAL_COMMUNITY): Payer: Self-pay | Admitting: Orthopedic Surgery

## 2018-12-09 ENCOUNTER — Other Ambulatory Visit: Payer: Self-pay

## 2018-12-09 ENCOUNTER — Encounter (HOSPITAL_COMMUNITY)
Admission: RE | Disposition: A | Discharge status: Home / Self Care | Payer: Self-pay | Source: Home / Self Care | Attending: Orthopaedic Surgery

## 2018-12-09 DIAGNOSIS — E669 Obesity, unspecified: Secondary | ICD-10-CM | POA: Diagnosis not present

## 2018-12-09 DIAGNOSIS — J4540 Moderate persistent asthma, uncomplicated: Secondary | ICD-10-CM | POA: Diagnosis not present

## 2018-12-09 DIAGNOSIS — S52322A Displaced transverse fracture of shaft of left radius, initial encounter for closed fracture: Secondary | ICD-10-CM | POA: Diagnosis not present

## 2018-12-09 DIAGNOSIS — W010XXA Fall on same level from slipping, tripping and stumbling without subsequent striking against object, initial encounter: Secondary | ICD-10-CM | POA: Insufficient documentation

## 2018-12-09 DIAGNOSIS — Z6837 Body mass index (BMI) 37.0-37.9, adult: Secondary | ICD-10-CM | POA: Insufficient documentation

## 2018-12-09 DIAGNOSIS — S52302A Unspecified fracture of shaft of left radius, initial encounter for closed fracture: Secondary | ICD-10-CM | POA: Diagnosis present

## 2018-12-09 DIAGNOSIS — Z9889 Other specified postprocedural states: Secondary | ICD-10-CM

## 2018-12-09 HISTORY — DX: Pneumonia, unspecified organism: J18.9

## 2018-12-09 HISTORY — PX: OPEN REDUCTION INTERNAL FIXATION (ORIF) DISTAL RADIAL FRACTURE: SHX5989

## 2018-12-09 LAB — CBC
HCT: 41.4 % (ref 39.0–52.0)
Hemoglobin: 13.9 g/dL (ref 13.0–17.0)
MCH: 30.3 pg (ref 26.0–34.0)
MCHC: 33.6 g/dL (ref 30.0–36.0)
MCV: 90.2 fL (ref 80.0–100.0)
Platelets: 385 10*3/uL (ref 150–400)
RBC: 4.59 MIL/uL (ref 4.22–5.81)
RDW: 12.4 % (ref 11.5–15.5)
WBC: 13.4 10*3/uL — ABNORMAL HIGH (ref 4.0–10.5)
nRBC: 0 % (ref 0.0–0.2)

## 2018-12-09 LAB — COMPREHENSIVE METABOLIC PANEL
ALT: 45 U/L — ABNORMAL HIGH (ref 0–44)
AST: 27 U/L (ref 15–41)
Albumin: 3.9 g/dL (ref 3.5–5.0)
Alkaline Phosphatase: 61 U/L (ref 38–126)
Anion gap: 13 (ref 5–15)
BUN: 17 mg/dL (ref 6–20)
CO2: 21 mmol/L — ABNORMAL LOW (ref 22–32)
Calcium: 8.9 mg/dL (ref 8.9–10.3)
Chloride: 105 mmol/L (ref 98–111)
Creatinine, Ser: 0.99 mg/dL (ref 0.61–1.24)
GFR calc Af Amer: >60 mL/min (ref >60)
GFR calc non Af Amer: >60 mL/min (ref >60)
Glucose, Bld: 105 mg/dL — ABNORMAL HIGH (ref 70–99)
Potassium: 3.9 mmol/L (ref 3.5–5.1)
Sodium: 139 mmol/L (ref 135–145)
Total Bilirubin: 0.7 mg/dL (ref 0.3–1.2)
Total Protein: 6.6 g/dL (ref 6.5–8.1)

## 2018-12-09 LAB — URIC ACID: Uric Acid, Serum: 8.6 mg/dL (ref 3.7–8.6)

## 2018-12-09 SURGERY — OPEN REDUCTION INTERNAL FIXATION (ORIF) DISTAL RADIUS FRACTURE
Anesthesia: General | Site: Arm Lower | Laterality: Left

## 2018-12-09 MED ORDER — CHLORHEXIDINE GLUCONATE 4 % EX LIQD: 60.0000 mL | Freq: Once | CUTANEOUS | Status: DC

## 2018-12-09 MED ORDER — ACETAMINOPHEN 325 MG PO TABS: 325.0000 mg | ORAL_TABLET | Freq: Once | ORAL | Status: DC | PRN

## 2018-12-09 MED ORDER — OXYCODONE-ACETAMINOPHEN 5-325 MG PO TABS: 1.0000 | ORAL_TABLET | Freq: Four times a day (QID) | ORAL | 0 refills | Status: AC | PRN

## 2018-12-09 MED ORDER — ACETAMINOPHEN 10 MG/ML IV SOLN: 1000.0000 mg | Freq: Once | INTRAVENOUS | Status: DC | PRN

## 2018-12-09 MED ORDER — 0.9 % SODIUM CHLORIDE (POUR BTL) OPTIME
TOPICAL | Status: DC | PRN
  Administered 2018-12-09: 17:00:00 1000 mL

## 2018-12-09 MED ORDER — LACTATED RINGERS IV SOLN: INTRAVENOUS | Status: DC

## 2018-12-09 MED ORDER — MIDAZOLAM HCL 2 MG/2ML IJ SOLN
INTRAMUSCULAR | Status: DC | PRN
  Administered 2018-12-09: 2 mg via INTRAVENOUS

## 2018-12-09 MED ORDER — FENTANYL CITRATE (PF) 250 MCG/5ML IJ SOLN
INTRAMUSCULAR | Status: DC | PRN
  Administered 2018-12-09 (×2): 25 ug via INTRAVENOUS

## 2018-12-09 MED ORDER — FENTANYL CITRATE (PF) 100 MCG/2ML IJ SOLN
100.0000 ug | Freq: Once | INTRAMUSCULAR | Status: AC
  Administered 2018-12-09: 16:00:00 100 ug via INTRAVENOUS

## 2018-12-09 MED ORDER — DEXAMETHASONE SODIUM PHOSPHATE 10 MG/ML IJ SOLN
INTRAMUSCULAR | Status: DC | PRN
  Administered 2018-12-09: 10 mg via INTRAVENOUS

## 2018-12-09 MED ORDER — MIDAZOLAM HCL 2 MG/2ML IJ SOLN
2.0000 mg | Freq: Once | INTRAMUSCULAR | Status: AC
  Administered 2018-12-09: 2 mg via INTRAVENOUS

## 2018-12-09 MED ORDER — LIDOCAINE HCL (CARDIAC) PF 100 MG/5ML IV SOSY
PREFILLED_SYRINGE | INTRAVENOUS | Status: DC | PRN
  Administered 2018-12-09: 60 mg via INTRATRACHEAL

## 2018-12-09 MED ORDER — DEXMEDETOMIDINE HCL 200 MCG/2ML IV SOLN
INTRAVENOUS | Status: DC | PRN
  Administered 2018-12-09: 8 ug via INTRAVENOUS

## 2018-12-09 MED ORDER — ACETAMINOPHEN 160 MG/5ML PO SOLN: 325.0000 mg | Freq: Once | ORAL | Status: DC | PRN

## 2018-12-09 MED ORDER — PROPOFOL 10 MG/ML IV BOLUS
INTRAVENOUS | Status: DC | PRN
  Administered 2018-12-09 (×2): 50 mg via INTRAVENOUS
  Administered 2018-12-09: 300 mg via INTRAVENOUS

## 2018-12-09 MED ORDER — MEPERIDINE HCL 25 MG/ML IJ SOLN: 6.2500 mg | INTRAMUSCULAR | Status: DC | PRN

## 2018-12-09 MED ORDER — LACTATED RINGERS IV SOLN
INTRAVENOUS | Status: DC
  Administered 2018-12-09 (×2): via INTRAVENOUS

## 2018-12-09 MED ORDER — PROMETHAZINE HCL 25 MG/ML IJ SOLN: 6.2500 mg | INTRAMUSCULAR | Status: DC | PRN

## 2018-12-09 MED ORDER — BUPIVACAINE-EPINEPHRINE (PF) 0.5% -1:200000 IJ SOLN
INTRAMUSCULAR | Status: DC | PRN
  Administered 2018-12-09: 30 mL via PERINEURAL

## 2018-12-09 MED ORDER — FENTANYL CITRATE (PF) 100 MCG/2ML IJ SOLN
INTRAMUSCULAR | Status: AC
  Administered 2018-12-09: 16:00:00 100 ug via INTRAVENOUS
  Filled 2018-12-09: qty 2

## 2018-12-09 MED ORDER — ONDANSETRON HCL 4 MG/2ML IJ SOLN
INTRAMUSCULAR | Status: DC | PRN
  Administered 2018-12-09: 4 mg via INTRAVENOUS

## 2018-12-09 MED ORDER — HYDROMORPHONE HCL 1 MG/ML IJ SOLN: 0.2500 mg | INTRAMUSCULAR | Status: DC | PRN

## 2018-12-09 MED ORDER — BUPIVACAINE HCL (PF) 0.25 % IJ SOLN
INTRAMUSCULAR | Status: AC
  Filled 2018-12-09: qty 30

## 2018-12-09 MED ORDER — MIDAZOLAM HCL 2 MG/2ML IJ SOLN
INTRAMUSCULAR | Status: AC
  Administered 2018-12-09: 16:00:00 2 mg via INTRAVENOUS
  Filled 2018-12-09: qty 2

## 2018-12-09 SURGICAL SUPPLY — 63 items
BANDAGE ACE 4X5 VEL STRL LF (GAUZE/BANDAGES/DRESSINGS) ×2 IMPLANT
BANDAGE ACE 6X5 VEL STRL LF (GAUZE/BANDAGES/DRESSINGS) ×2 IMPLANT
BIT DRILL 110X2.5XQCK CNCT (BIT) IMPLANT
BIT DRILL 2.5 (BIT) ×2
BIT DRL 110X2.5XQCK CNCT (BIT) ×1
BNDG ESMARK 4X9 LF (GAUZE/BANDAGES/DRESSINGS) IMPLANT
CLOSURE WOUND 1/2 X4 (GAUZE/BANDAGES/DRESSINGS)
CORD BIPOLAR FORCEPS 12FT (ELECTRODE) ×3 IMPLANT
COVER SURGICAL LIGHT HANDLE (MISCELLANEOUS) ×3 IMPLANT
COVER WAND RF STERILE (DRAPES) ×3 IMPLANT
DRAPE INCISE IOBAN 66X45 STRL (DRAPES) ×2 IMPLANT
DRAPE OEC MINIVIEW 54X84 (DRAPES) IMPLANT
DRSG PAD ABDOMINAL 8X10 ST (GAUZE/BANDAGES/DRESSINGS) IMPLANT
DURAPREP 26ML APPLICATOR (WOUND CARE) ×3 IMPLANT
ELECT REM PT RETURN 9FT ADLT (ELECTROSURGICAL) ×3
ELECTRODE REM PT RTRN 9FT ADLT (ELECTROSURGICAL) ×1 IMPLANT
GAUZE SPONGE 4X4 12PLY STRL (GAUZE/BANDAGES/DRESSINGS) IMPLANT
GAUZE SPONGE 4X4 16PLY XRAY LF (GAUZE/BANDAGES/DRESSINGS) ×2 IMPLANT
GAUZE XEROFORM 1X8 LF (GAUZE/BANDAGES/DRESSINGS) IMPLANT
GLOVE BIOGEL PI IND STRL 8 (GLOVE) ×2 IMPLANT
GLOVE BIOGEL PI INDICATOR 8 (GLOVE) ×4
GLOVE ORTHO TXT STRL SZ7.5 (GLOVE) ×6 IMPLANT
GLOVE SURG SS PI 7.5 STRL IVOR (GLOVE) ×2 IMPLANT
GOWN STRL REUS W/ TWL LRG LVL3 (GOWN DISPOSABLE) ×1 IMPLANT
GOWN STRL REUS W/ TWL XL LVL3 (GOWN DISPOSABLE) ×1 IMPLANT
GOWN STRL REUS W/TWL 2XL LVL3 (GOWN DISPOSABLE) ×3 IMPLANT
GOWN STRL REUS W/TWL LRG LVL3 (GOWN DISPOSABLE) ×2
GOWN STRL REUS W/TWL XL LVL3 (GOWN DISPOSABLE) ×2
K-WIRE 1.4X100 (WIRE)
KIT BASIN OR (CUSTOM PROCEDURE TRAY) ×3 IMPLANT
KIT TURNOVER KIT B (KITS) ×3 IMPLANT
KWIRE 1.4X100 (WIRE) IMPLANT
MANIFOLD NEPTUNE II (INSTRUMENTS) ×3 IMPLANT
NEEDLE 22X1 1/2 (OR ONLY) (NEEDLE) IMPLANT
NS IRRIG 1000ML POUR BTL (IV SOLUTION) ×3 IMPLANT
PACK ORTHO EXTREMITY (CUSTOM PROCEDURE TRAY) ×3 IMPLANT
PAD ARMBOARD 7.5X6 YLW CONV (MISCELLANEOUS) ×6 IMPLANT
PAD CAST 4YDX4 CTTN HI CHSV (CAST SUPPLIES) IMPLANT
PADDING CAST COTTON 4X4 STRL (CAST SUPPLIES) ×2
PLATE 7 HOLE (Plate) ×2 IMPLANT
SCREW CORTICAL 3.5 16MM (Screw) ×2 IMPLANT
SCREW CORTICAL 3.5 18MM (Screw) ×12 IMPLANT
SPLINT FIBERGLASS 4X30 (CAST SUPPLIES) ×4 IMPLANT
SPONGE LAP 4X18 RFD (DISPOSABLE) ×6 IMPLANT
STAPLER VISISTAT 35W (STAPLE) ×3 IMPLANT
STRIP CLOSURE SKIN 1/2X4 (GAUZE/BANDAGES/DRESSINGS) ×1 IMPLANT
SUCTION FRAZIER HANDLE 10FR (MISCELLANEOUS) ×2
SUCTION TUBE FRAZIER 10FR DISP (MISCELLANEOUS) ×1 IMPLANT
SUT PROLENE 3 0 PS 1 (SUTURE) ×1 IMPLANT
SUT SILK 2 0 (SUTURE) ×2
SUT SILK 2-0 18XBRD TIE 12 (SUTURE) IMPLANT
SUT VIC AB 2-0 CT1 27 (SUTURE) ×4
SUT VIC AB 2-0 CT1 TAPERPNT 27 (SUTURE) IMPLANT
SUT VIC AB 3-0 X1 27 (SUTURE) ×1 IMPLANT
SUT VICRYL 4-0 PS2 18IN ABS (SUTURE) IMPLANT
SYR CONTROL 10ML LL (SYRINGE) IMPLANT
TOWEL GREEN STERILE (TOWEL DISPOSABLE) ×3 IMPLANT
TOWEL GREEN STERILE FF (TOWEL DISPOSABLE) ×3 IMPLANT
TUBE CONNECTING 12'X1/4 (SUCTIONS) ×1
TUBE CONNECTING 12X1/4 (SUCTIONS) ×2 IMPLANT
UNDERPAD 30X30 (UNDERPADS AND DIAPERS) ×3 IMPLANT
WATER STERILE IRR 1000ML POUR (IV SOLUTION) ×3 IMPLANT
YANKAUER SUCT BULB TIP NO VENT (SUCTIONS) ×3 IMPLANT

## 2018-12-09 NOTE — Anesthesia Procedure Notes (Signed)
Anesthesia Regional Block: Supraclavicular block   Pre-Anesthetic Checklist: ,, timeout performed, Correct Patient, Correct Site, Correct Laterality, Correct Procedure, Correct Position, site marked, Risks and benefits discussed,  Surgical consent,  Pre-op evaluation,  At surgeon's request and post-op pain management  Laterality: Left  Prep: chloraprep       Needles:  Injection technique: Single-shot  Needle Type: Echogenic Stimulator Needle     Needle Length: 9cm  Needle Gauge: 21     Additional Needles:   Procedures:,,,, ultrasound used (permanent image in chart),,,,  Narrative:  Start time: 12/09/2018 4:00 PM End time: 12/09/2018 4:10 PM Injection made incrementally with aspirations every 5 mL.  Performed by: Personally  Anesthesiologist: Effie Berkshire, MD  Additional Notes: Patient tolerated the procedure well. Local anesthetic introduced in an incremental fashion under minimal resistance after negative aspirations. No paresthesias were elicited. After completion of the procedure, no acute issues were identified and patient continued to be monitored by RN.

## 2018-12-09 NOTE — Transfer of Care (Signed)
Immediate Anesthesia Transfer of Care Note  Patient: Bob [name]  Procedure(s) Performed: OPEN REDUCTION INTERNAL FIXATION (ORIF) DISTAL RADIAL FRACTURE (Left Arm Lower)  Patient Location: PACU  Anesthesia Type:GA combined with regional for post-op pain  Level of Consciousness: awake and alert   Airway & Oxygen Therapy: Patient Spontanous Breathing and Patient connected to face mask oxygen  Post-op Assessment: Report given to RN and Post -op Vital signs reviewed and stable  Post vital signs: Reviewed and stable  Last Vitals:  Vitals Value Taken Time  BP 148/85 12/09/18 1855  Temp    Pulse 97 12/09/18 1857  Resp 21 12/09/18 1857  SpO2 100 % 12/09/18 1857  Vitals shown include unvalidated device data.  Last Pain:  Vitals:   12/09/18 1505  TempSrc:   PainSc: 0-No pain      Patients Stated Pain Goal: 3 (18/56/31 4970)  Complications: No apparent anesthesia complications

## 2018-12-09 NOTE — H&P (Signed)
Progress Notes by Eldred MangesYates, Nazirah Tri C, MD at 12/08/2018 9:30 AM Author: Eldred MangesYates, Savita Runner C, MD Author Type: Physician Filed: 12/08/2018 10:38 AM  Note Status: Signed Cosign: Cosign Not Required Encounter Date: 12/08/2018  Editor: Eldred MangesYates, Renly Roots C, MD (Physician)      Office Visit Note              Patient: Bob [name]                                            Date of Birth: February 15, 1986                                                    MRN: [id number] Visit Date: 12/08/2018                                                                     Requested by: Eartha InchBadger, Latron C, MD [address] [place],  [place] [id number] PCP: Eartha InchBadger, Ender C, MD   Assessment & Plan: Visit Diagnoses:  1. Closed displaced transverse fracture of shaft of left radius, initial encounter     Plan: Patient require open reduction internal fixation left radial shaft fracture with plate fixation.  We discussed risks of infection, nonunion nerve injury with problems with either sensation his hands or problems with motion of his fingers.  Questions were elicited answered he understands request to proceed.  Follow-Up Instructions: Return for pre-op .   Orders:  No orders of the defined types were placed in this encounter.  No orders of the defined types were placed in this encounter.     Procedures: No procedures performed   Clinical Data: No additional findings.   Subjective:    Chief Complaint  Patient presents with  . 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  Ulna was intact he had immediate severe pain inability to move his arm.  No past history of injury to his left arm other than some some lateral ligament surgery at the wrist.   He was seen in emergency room x-rays were obtained and is placed in a splint.  X-rays of her shoulder were negative.  Review of Systems Positive for bilateral pes planus.  History of old Lisfranc injury lumbar spondylolysis.  Left wrist ligamentous surgery.  History of hyperuricemia.  Otherwise negative is obtains HPI.  Objective: Vital Signs: Ht 5\' 11"  (1.803 m)   Wt 270 lb (122.5 kg)   BMI 37.66 kg/m   Physical Exam Constitutional:      Appearance: He is well-developed.  HENT:     Head: Normocephalic and atraumatic.  Eyes:     Pupils: Pupils are equal, round, and reactive to light.  Neck:  Thyroid: No thyromegaly.     Trachea: No tracheal deviation.  Cardiovascular:     Rate and Rhythm: Normal rate.  Pulmonary:     Effort: Pulmonary effort is normal.     Breath sounds: No wheezing.  Abdominal:     General: Bowel sounds are normal.     Palpations: Abdomen is soft.  Skin:    General: Skin is warm and dry.     Capillary Refill: Capillary refill takes less than 2 seconds.  Neurological:     Mental Status: He is alert and oriented to person, place, and time.  Psychiatric:        Behavior: Behavior normal.        Thought Content: Thought content normal.        Judgment: Judgment normal.     Ortho Exam patient has finger extension also finger flexion intact ulnar median and radial sensory nerve. Pulses normal.  Specialty Comments:  No specialty comments available.  Imaging: CLINICAL DATA: Left elbow, forearm, and wrist pain after fall while doing yard work.  EXAM: LEFT FOREARM - 2 VIEW  COMPARISON: None.  FINDINGS: Proximal radial shaft fracture with 3/4 which displacement. No significant angulation. No associated ulna fracture. Soft tissue edema noted at the fracture site. Punctate densities projecting over the adjacent soft tissues may be soft tissue calcifications or radiopaque debris.  IMPRESSION: Displaced proximal radial shaft fracture.    Electronically Signed By: Keith Rake M.D. On: 12/06/2018 23:58    PMFS History:     Patient Active Problem List   Diagnosis Date Noted  . Fracture of radial shaft, left, closed 12/08/2018  . Elevated blood-pressure reading without diagnosis of hypertension 11/12/2016  . Moderate persistent asthma, uncomplicated 40/81/4481  . Allergic rhinitis 04/04/2015  . Medication management 11/02/2014  . Vitamin D deficiency 11/02/2014  . Obesity 11/02/2014  . Bilateral foot pain 11/13/2011  . Gait abnormality 11/13/2011       Past Medical History:  Diagnosis Date  . Asthma          Family History  Problem Relation Age of Onset  . Allergic rhinitis Sister   . Asthma Sister   . Angioedema Neg Hx   . Eczema Neg Hx   . Immunodeficiency Neg Hx   . Urticaria Neg Hx          Past Surgical History:  Procedure Laterality Date  . KNEE SURGERY    . Left wrist surgery Left 05/14/2014   Social History        Occupational History  . Not on file  Tobacco Use  . Smoking status: Never Smoker  . Smokeless tobacco: Current User    Types: Chew  Substance and Sexual Activity  . Alcohol use: Yes    Alcohol/week: 5.0 standard drinks    Types: 5 Standard drinks or equivalent per week    Comment: social  . Drug use: No  . Sexual activity: Not on file

## 2018-12-09 NOTE — Interval H&P Note (Signed)
History and Physical Interval Note:  12/09/2018 3:54 PM  Bob [name]  has presented today for surgery, with the diagnosis of Left Radial Shaft Fracture.  The various methods of treatment have been discussed with the patient and family. After consideration of risks, benefits and other options for treatment, the patient has consented to  Procedure(s): OPEN REDUCTION INTERNAL FIXATION (ORIF) DISTAL RADIAL FRACTURE (Left) as a surgical intervention.  The patient's history has been reviewed, patient examined, no change in status, stable for surgery.  I have reviewed the patient's chart and labs.  Questions were answered to the patient's satisfaction.     Marybelle Killings

## 2018-12-09 NOTE — Anesthesia Procedure Notes (Signed)
Procedure Name: LMA Insertion Date/Time: 12/09/2018 5:08 PM Performed by: Kathryne Hitch, CRNA Pre-anesthesia Checklist: Patient identified, Emergency Drugs available, Suction available and Patient being monitored Patient Re-evaluated:Patient Re-evaluated prior to induction Oxygen Delivery Method: Circle system utilized Preoxygenation: Pre-oxygenation with 100% oxygen Induction Type: IV induction LMA: LMA inserted LMA Size: 5.0 Number of attempts: 1 Placement Confirmation: breath sounds checked- equal and bilateral Tube secured with: Tape Dental Injury: Teeth and Oropharynx as per pre-operative assessment

## 2018-12-09 NOTE — Op Note (Signed)
Preop diagnosis: Displaced left radial shaft fracture, closed.  Postop diagnosis: Same  Procedure: open reduction internal fixation left radius.  Surgeon: Kyjuan Gause yatesMD  Anesthesia: Preop  supraclavicular block plus general.  Tourniquet: 1 hour 15 minutes.  X350 pressure.  Complications: None.  Implants Zimmer 3.5 compression locking plate.  Procedure after induction of preoperative block general anesthesia 3 g Ancef prophylaxis proximal arm tourniquet standard prepping and draping sterile skin marker was made starting in area just distal to the biceps tendon and extending toward the radial styloid overlying the radial artery at the wrist.  Incision was made in the proximal third of the forearm starting at the biceps tuberosity and extending past the mid shaft.  1 prominent vein was ligated.  Lateral antebrachial brachial cutaneous nerve was not visualized but was cautiously blunt dissected to make sure it was not injured.  Brachial radialis edge was found and it was gradually elevated.  Radial sensory nerve was identified and elevated to brachial radialis transverse vessels that came off the radial artery were identified coagulated with the bipolar.  With the forearm supinated supinator was cut it at the insertion site and pushed back so was protecting the posterior interosseous nerve.  Distally forearm was pronated and the pronator was taken off with attachment.  This gave enough room to strip soft tissue muscle off the into the fracture and fracture was reduced.  A 7 hole plate was selected with the middle hole sitting directly over the fracture.  Care was taken to keep in mind the posterior interosseous nerve position.  Initial fixation is 1 of the screws being tightened down we lost reduction and remove some of the distal screws and then reapplied repeat compression.  Fracture was anatomic AP lateral and oblique x-rays.  Plate had to have slight been placed in it to fit the cortex of the radius  as expected.  Tourniquet was deflated operative field was dry.  Radial artery and associated vena communacantes  did not have any bleeding.  Radial sensory nerve was preserved supinator was pushed back to its normal position as well as the pronator.  Copious irrigation followed by reapproximation subcutaneous tissue 2-0 Vicryl.  Skin staple closure postop long-arm dressing with elbow at 90 and forearm in neutral was performed patient tolerated procedure well transferred to care room in stable condition.

## 2018-12-09 NOTE — Anesthesia Preprocedure Evaluation (Addendum)
Anesthesia Evaluation  Patient identified by MRN, date of birth, ID band Patient awake    Reviewed: Allergy & Precautions, NPO status , Patient's Chart, lab work & pertinent test results  Airway Mallampati: II  TM Distance: >3 FB Neck ROM: Full    Dental  (+) Teeth Intact, Dental Advisory Given   Pulmonary asthma ,    breath sounds clear to auscultation       Cardiovascular negative cardio ROS   Rhythm:Regular Rate:Normal     Neuro/Psych negative neurological ROS     GI/Hepatic negative GI ROS, Neg liver ROS,   Endo/Other  negative endocrine ROS  Renal/GU negative Renal ROS     Musculoskeletal negative musculoskeletal ROS (+)   Abdominal (+) + obese,   Peds  Hematology negative hematology ROS (+)   Anesthesia Other Findings   Reproductive/Obstetrics                            Anesthesia Physical Anesthesia Plan  ASA: II  Anesthesia Plan: General   Post-op Pain Management: GA combined w/ Regional for post-op pain   Induction: Intravenous  PONV Risk Score and Plan: 3 and Ondansetron, Dexamethasone and Midazolam  Airway Management Planned: LMA  Additional Equipment: None  Intra-op Plan:   Post-operative Plan: Extubation in OR  Informed Consent: I have reviewed the patients History and Physical, chart, labs and discussed the procedure including the risks, benefits and alternatives for the proposed anesthesia with the patient or authorized representative who has indicated his/her understanding and acceptance.     Dental advisory given  Plan Discussed with: CRNA  Anesthesia Plan Comments: (COVID-19 Labs  No results for input(s): DDIMER, FERRITIN, LDH, CRP in the last 72 hours.  Lab Results      Component                Value               Date                      [id number]              NEGATIVE            12/08/2018            )      Anesthesia Quick  Evaluation

## 2018-12-09 NOTE — Anesthesia Postprocedure Evaluation (Signed)
Anesthesia Post Note  Patient: Bob [name]  Procedure(s) Performed: OPEN REDUCTION INTERNAL FIXATION (ORIF) DISTAL RADIAL FRACTURE (Left Arm Lower)     Patient location during evaluation: PACU Anesthesia Type: General Level of consciousness: sedated Pain management: pain level controlled Vital Signs Assessment: post-procedure vital signs reviewed and stable Respiratory status: spontaneous breathing and respiratory function stable Cardiovascular status: stable Postop Assessment: no apparent nausea or vomiting Anesthetic complications: no Comments: Pt reports numbness of the tip of his tongue: normal motor by exam and taste by report.  Pt reassured that this should resolve with in 48 hour, but to contact us if no better and he may need ENT evaluation.    Last Vitals:  Vitals:   12/09/18 1910 12/09/18 1925  BP: 136/88 130/84  Pulse: 95 86  Resp: (!) 22 18  Temp:  36.8 C  SpO2: 98% 95%    Last Pain:  Vitals:   12/09/18 1925  TempSrc:   PainSc: 0-No pain                 Mandeep Ferch DANIEL

## 2018-12-09 NOTE — Discharge Instructions (Addendum)
Leave splint on to return to see Dr. Lorin Mercy in the office.  You can apply some ice over the forearm but since you had a block you will need to not leave it on too long since your arm may be numb from the block.  You can sponge bathe until your return office visit.  Take some pain medicine before you go to sleep tonight even if the block is still working.  Block usually wears off in about an hour time.  And may last all night long and a good part of tomorrow possibly.  Your pain medication has been sent into your pharmacy [facility].  Call Dr. Lorin Mercy cell phone for problems thanks

## 2018-12-10 MED ORDER — LIDOCAINE 2% (20 MG/ML) 5 ML SYRINGE
INTRAMUSCULAR | Status: AC
  Filled 2018-12-10: qty 5

## 2018-12-10 MED ORDER — PROPOFOL 10 MG/ML IV BOLUS
INTRAVENOUS | Status: AC
  Filled 2018-12-10: qty 20

## 2018-12-10 MED ORDER — ALBUTEROL SULFATE HFA 108 (90 BASE) MCG/ACT IN AERS
INHALATION_SPRAY | RESPIRATORY_TRACT | Status: AC
  Filled 2018-12-10: qty 6.7

## 2018-12-10 MED ORDER — DEXAMETHASONE SODIUM PHOSPHATE 10 MG/ML IJ SOLN
INTRAMUSCULAR | Status: AC
  Filled 2018-12-10: qty 1

## 2018-12-10 MED ORDER — FENTANYL CITRATE (PF) 250 MCG/5ML IJ SOLN
INTRAMUSCULAR | Status: AC
  Filled 2018-12-10: qty 5

## 2018-12-10 MED ORDER — ONDANSETRON HCL 4 MG/2ML IJ SOLN
INTRAMUSCULAR | Status: AC
  Filled 2018-12-10: qty 2

## 2018-12-10 MED ORDER — MIDAZOLAM HCL 2 MG/2ML IJ SOLN
INTRAMUSCULAR | Status: AC
  Filled 2018-12-10: qty 2

## 2018-12-14 ENCOUNTER — Encounter (HOSPITAL_COMMUNITY): Payer: Self-pay | Admitting: Orthopaedic Surgery

## 2018-12-18 ENCOUNTER — Ambulatory Visit (INDEPENDENT_AMBULATORY_CARE_PROVIDER_SITE_OTHER): Payer: 59 | Admitting: Orthopaedic Surgery

## 2018-12-18 ENCOUNTER — Encounter: Payer: Self-pay | Admitting: Orthopaedic Surgery

## 2018-12-18 VITALS — Ht 71.0 in | Wt 270.0 lb

## 2018-12-18 DIAGNOSIS — S52322D Displaced transverse fracture of shaft of left radius, subsequent encounter for closed fracture with routine healing: Secondary | ICD-10-CM

## 2018-12-18 NOTE — Progress Notes (Signed)
   Post-Op Visit Note   Patient: Bob [name]           Date of Birth: [birth year]           MRN: [id number] Visit Date: 12/18/2018 PCP: Chesley Noon, MD   Assessment & Plan: Post-ORIF left transverse proximal radial shaft fracture.  Staples removed and in the midportion he started to have slight separation.  Proximal third of the staples were left Steri-Strips were applied he will return in 1 week for the rest of the staple removal.  Steri-Strips applied.  He is placed in a removable posterior splint he can remove it to work on some elbow flexion extension gentle pronation supination then reapplication of his splint in his sling.  He will continue use ice he is only used a few of the pain pills then stopped.  He has some slight decrease sensation radial sensory distribution.  There was visualized during surgery and was intact.  Sensation is present but feels tingly to him.  Compartment is soft.  Chief Complaint:  Chief Complaint  Patient presents with  . Left Forearm - Routine Post Op   Visit Diagnoses:  1. Closed displaced transverse fracture of shaft of left radius with routine healing, subsequent encounter     Plan: Return 4 weeks 2 view x-rays left forearm on return.  Follow-Up Instructions: No follow-ups on file.   Orders:  No orders of the defined types were placed in this encounter.  No orders of the defined types were placed in this encounter.   Imaging: No results found.  PMFS History: Patient Active Problem List   Diagnosis Date Noted  . Fracture of radial shaft, left, closed 12/08/2018  . Elevated blood-pressure reading without diagnosis of hypertension 11/12/2016  . Moderate persistent asthma, uncomplicated 35/00/9381  . Allergic rhinitis 04/04/2015  . Medication management 11/02/2014  . Vitamin D deficiency 11/02/2014  . Obesity 11/02/2014  . Bilateral foot pain 11/13/2011  . Gait abnormality 11/13/2011   Past Medical History:  Diagnosis Date  .  Asthma   . Pneumonia     Family History  Problem Relation Age of Onset  . Allergic rhinitis Sister   . Asthma Sister   . Angioedema Neg Hx   . Eczema Neg Hx   . Immunodeficiency Neg Hx   . Urticaria Neg Hx     Past Surgical History:  Procedure Laterality Date  . KNEE SURGERY    . Left wrist surgery Left 05/14/2014  . OPEN REDUCTION INTERNAL FIXATION (ORIF) DISTAL RADIAL FRACTURE Left 12/09/2018   Procedure: OPEN REDUCTION INTERNAL FIXATION (ORIF) DISTAL RADIAL FRACTURE;  Surgeon: Marybelle Killings, MD;  Location: [place];  Service: Orthopedics;  Laterality: Left;   Social History   Occupational History  . Not on file  Tobacco Use  . Smoking status: Never Smoker  . Smokeless tobacco: Current User    Types: Chew  Substance and Sexual Activity  . Alcohol use: Yes    Alcohol/week: 5.0 standard drinks    Types: 5 Standard drinks or equivalent per week    Comment: social  . Drug use: No  . Sexual activity: Not on file

## 2018-12-25 ENCOUNTER — Ambulatory Visit (INDEPENDENT_AMBULATORY_CARE_PROVIDER_SITE_OTHER): Payer: 59 | Admitting: Orthopaedic Surgery

## 2018-12-25 ENCOUNTER — Encounter: Payer: Self-pay | Admitting: Orthopaedic Surgery

## 2018-12-25 VITALS — Ht 71.0 in | Wt 270.0 lb

## 2018-12-25 DIAGNOSIS — S52322D Displaced transverse fracture of shaft of left radius, subsequent encounter for closed fracture with routine healing: Secondary | ICD-10-CM

## 2018-12-25 NOTE — Progress Notes (Signed)
   Post-Op Visit Note   Patient: Bob [name]           Date of Birth: 28-May-1986           MRN: [id number] Visit Date: 12/25/2018 PCP: Chesley Noon, MD   Assessment & Plan: Steri-Strips change the remainder of his staples were removed he is getting return of some the numbness he had in the radial sensory distribution related to swelling from the fracture.  No x-rays were done today.  All staples were removed Steri-Strips change.  Return 3 weeks for repeat 2 view x-rays AP and lateral left forearm on return.  He has full pronation and will continue to work until he gets full supination.  Chief Complaint:  Chief Complaint  Patient presents with  . Left Forearm - Follow-up    12/09/2018 ORIF Distal Radius Fracture   Visit Diagnoses:  1. Closed displaced transverse fracture of shaft of left radius with routine healing, subsequent encounter     Plan: ROV 3 wks repeat xrays   Follow-Up Instructions: Return in about 3 weeks (around 01/15/2019).   Orders:  No orders of the defined types were placed in this encounter.  No orders of the defined types were placed in this encounter.   Imaging: No results found.  PMFS History: Patient Active Problem List   Diagnosis Date Noted  . Fracture of radial shaft, left, closed 12/08/2018  . Elevated blood-pressure reading without diagnosis of hypertension 11/12/2016  . Moderate persistent asthma, uncomplicated 02/72/5366  . Allergic rhinitis 04/04/2015  . Medication management 11/02/2014  . Vitamin D deficiency 11/02/2014  . Obesity 11/02/2014  . Bilateral foot pain 11/13/2011  . Gait abnormality 11/13/2011   Past Medical History:  Diagnosis Date  . Asthma   . Pneumonia     Family History  Problem Relation Age of Onset  . Allergic rhinitis Sister   . Asthma Sister   . Angioedema Neg Hx   . Eczema Neg Hx   . Immunodeficiency Neg Hx   . Urticaria Neg Hx     Past Surgical History:  Procedure Laterality Date  . KNEE SURGERY     . Left wrist surgery Left 05/14/2014  . OPEN REDUCTION INTERNAL FIXATION (ORIF) DISTAL RADIAL FRACTURE Left 12/09/2018   Procedure: OPEN REDUCTION INTERNAL FIXATION (ORIF) DISTAL RADIAL FRACTURE;  Surgeon: Marybelle Killings, MD;  Location: [facility];  Service: Orthopedics;  Laterality: Left;   Social History   Occupational History  . Not on file  Tobacco Use  . Smoking status: Never Smoker  . Smokeless tobacco: Current User    Types: Chew  Substance and Sexual Activity  . Alcohol use: Yes    Alcohol/week: 5.0 standard drinks    Types: 5 Standard drinks or equivalent per week    Comment: social  . Drug use: No  . Sexual activity: Not on file

## 2019-01-15 ENCOUNTER — Ambulatory Visit (INDEPENDENT_AMBULATORY_CARE_PROVIDER_SITE_OTHER): Payer: 59 | Admitting: Orthopaedic Surgery

## 2019-01-15 ENCOUNTER — Encounter: Payer: Self-pay | Admitting: Orthopaedic Surgery

## 2019-01-15 ENCOUNTER — Ambulatory Visit (INDEPENDENT_AMBULATORY_CARE_PROVIDER_SITE_OTHER): Payer: 59

## 2019-01-15 VITALS — Ht 71.0 in | Wt 270.0 lb

## 2019-01-15 DIAGNOSIS — S52322D Displaced transverse fracture of shaft of left radius, subsequent encounter for closed fracture with routine healing: Secondary | ICD-10-CM | POA: Diagnosis not present

## 2019-01-15 MED ORDER — ALLOPURINOL 300 MG PO TABS: ORAL_TABLET | ORAL | 0 refills | Status: DC

## 2019-01-15 NOTE — Progress Notes (Signed)
33 year old male returns post RF radial shaft fracture left on 12/09/2018.  He has near full supination he has full pronation.  Flexion is good he has about 30 degrees of extension he lacks.  We will give him specific exercises to work on extension when she can do.  He is not making progress he will call me.  Recheck 3 weeks.  Repeat x-rays left forearm on return.  Allopurinol sent in with his history of gout.  To take anti-inflammatory for a week which he has not picked up yet and then will start with alloprinol 100 mg daily for a week then go to 200 mg and up to 300 mg.

## 2019-02-09 ENCOUNTER — Ambulatory Visit: Payer: 59 | Admitting: Orthopaedic Surgery

## 2019-02-14 ENCOUNTER — Other Ambulatory Visit: Payer: Self-pay | Admitting: Orthopaedic Surgery

## 2019-02-15 NOTE — Telephone Encounter (Signed)
Please advise 

## 2019-03-22 ENCOUNTER — Other Ambulatory Visit: Payer: Self-pay | Admitting: Orthopaedic Surgery

## 2019-03-22 NOTE — Telephone Encounter (Signed)
Could you please advise since Dr. Yates is out of the office? 

## 2019-03-28 ENCOUNTER — Other Ambulatory Visit: Payer: Self-pay | Admitting: Family Medicine

## 2019-03-30 ENCOUNTER — Other Ambulatory Visit: Payer: Self-pay | Admitting: *Deleted

## 2019-03-30 ENCOUNTER — Other Ambulatory Visit: Payer: Self-pay

## 2019-03-30 ENCOUNTER — Other Ambulatory Visit: Payer: Self-pay | Admitting: Allergy and Immunology

## 2019-03-30 MED ORDER — BREO ELLIPTA 200-25 MCG/INH IN AEPB: 1.0000 | INHALATION_SPRAY | Freq: Every day | RESPIRATORY_TRACT | 0 refills | Status: DC

## 2019-03-30 MED ORDER — ALBUTEROL SULFATE HFA 108 (90 BASE) MCG/ACT IN AERS: INHALATION_SPRAY | RESPIRATORY_TRACT | 0 refills | Status: DC

## 2019-03-30 NOTE — Telephone Encounter (Signed)
Spoke with patient and made him aware that [facility] [address]. Patient was advised that no further refills will be authorized and that he needs to keep his office visit scheduled in November. Patient verbalized understanding.

## 2019-03-30 NOTE — Telephone Encounter (Signed)
Patient called stating that he needs a refill on Breo and Albuterol.  Patient scheduled an appointment for 04/26/2019 for follow up.  Please advise

## 2019-04-15 ENCOUNTER — Other Ambulatory Visit: Payer: Self-pay | Admitting: Physician Assistant

## 2019-04-15 NOTE — Telephone Encounter (Signed)
Please advise 

## 2019-04-26 ENCOUNTER — Other Ambulatory Visit: Payer: Self-pay

## 2019-04-26 ENCOUNTER — Ambulatory Visit (INDEPENDENT_AMBULATORY_CARE_PROVIDER_SITE_OTHER): Payer: 59 | Admitting: Allergy and Immunology

## 2019-04-26 ENCOUNTER — Encounter: Payer: Self-pay | Admitting: Allergy and Immunology

## 2019-04-26 DIAGNOSIS — J3089 Other allergic rhinitis: Secondary | ICD-10-CM | POA: Diagnosis not present

## 2019-04-26 DIAGNOSIS — J4540 Moderate persistent asthma, uncomplicated: Secondary | ICD-10-CM | POA: Diagnosis not present

## 2019-04-26 MED ORDER — BREO ELLIPTA 200-25 MCG/INH IN AEPB: 1.0000 | INHALATION_SPRAY | Freq: Every day | RESPIRATORY_TRACT | 0 refills | Status: DC

## 2019-04-26 MED ORDER — ALBUTEROL SULFATE HFA 108 (90 BASE) MCG/ACT IN AERS: INHALATION_SPRAY | RESPIRATORY_TRACT | 0 refills | Status: DC

## 2019-04-26 MED ORDER — CARBINOXAMINE MALEATE 4 MG PO TABS: 4.0000 mg | ORAL_TABLET | Freq: Three times a day (TID) | ORAL | 5 refills | Status: DC | PRN

## 2019-04-26 NOTE — Progress Notes (Signed)
Follow-up Note  RE: Bob [name] MRN: [id number] DOB: [birth year] Date of Office Visit: 04/26/2019  Primary care provider: Eartha Inch, MD Referring provider: Eartha Inch, MD  History of present illness: Bob [name] is a 33 y.o. male with persistent asthma and allergic rhinitis presenting today for follow-up.  He was last seen in this clinic on July 17, 2018.  He reports that his asthma has been well controlled with Breo Ellipta 200-25 g, 1 inhalation daily.  He rarely requires albuterol rescue and does not experience limitations in normal daily activity or nocturnal awakenings due to lower respiratory symptoms.  His nasal allergy symptoms are reported as well controlled with the exception of when he has been [place] during rag[place] season.  Assessment and plan: Moderate persistent asthma, uncomplicated Well-controlled.  Continue Breo Ellipta 200/25 g, one inhalation daily, and albuterol HFA, 1-2 inhalations every 4-6 hours as needed.  Refill prescriptions have been provided.  Subjective and objective measures of pulmonary function will be followed and the treatment plan will be adjusted accordingly.  Allergic rhinitis  Continue appropriate allergen avoidance measures, fluticasone nasal spray as needed, carbinoxamine 4 mg every 6-8 hours if needed.  Nasal saline spray (i.e., Simply Saline) or nasal saline lavage (i.e., NeilMed) is recommended as needed and prior to medicated nasal sprays.   Meds ordered this encounter  Medications  . albuterol (VENTOLIN HFA) 108 (90 Base) MCG/ACT inhaler    Sig: INHALE 1 TO 2 INHALATIONS EVERY 4 HOURS AS NEEDED FOR WHEEZE OR SHORTNESS OF BREATH    Dispense:  18 g    Refill:  0    Patient will not received further refills. Must keep 11/28 appointment.  . Carbinoxamine Maleate 4 MG TABS    Sig: Take 1 tablet (4 mg total) by mouth 3 (three) times daily as needed.    Dispense:  360 tablet    Refill:  5  .  fluticasone furoate-vilanterol (BREO ELLIPTA) 200-25 MCG/INH AEPB    Sig: Inhale 1 puff into the lungs daily.    Dispense:  28 each    Refill:  0    Patient will not receive further refills. Must keep 11/28 appointment.    Diagnostics: Spirometry was not performed today due to Covid restrictions.    Physical examination: Blood pressure (!) 142/96, pulse (!) 110, temperature (!) 97.5 F (36.4 C), temperature source Temporal, resp. rate 18, height 5\' 11"  (1.803 m), weight 276 lb (125.2 kg), SpO2 97 %.  General: Alert, interactive, in no acute distress. HEENT: TMs pearly gray, turbinates mildly edematous without discharge, post-pharynx mildly erythematous. Neck: Supple without lymphadenopathy. Lungs: Clear to auscultation without wheezing, rhonchi or rales. CV: Normal S1, S2 without murmurs. Skin: Warm and dry, without lesions or rashes.  The following portions of the patient's history were reviewed and updated as appropriate: allergies, current medications, past family history, past medical history, past social history, past surgical history and problem list.  Current Outpatient Medications  Medication Sig Dispense Refill  . albuterol (VENTOLIN HFA) 108 (90 Base) MCG/ACT inhaler INHALE 1 TO 2 INHALATIONS EVERY 4 HOURS AS NEEDED FOR WHEEZE OR SHORTNESS OF BREATH 18 g 0  . allopurinol (ZYLOPRIM) 300 MG tablet START 1 TABLET DAILY FOR 1 WEEK AFTER YOU HAVE BEEN ON ANTI-INFLAMMATORIES REGULARLY FOR A FULL WEEK. AFTER THE FIRST WEEK GO TO 2 TABLETS DAILY FOR A WEEK AND THEN YOU CAN GO TO 3 TABLETS DAILY. 90 tablet 0  . Carbinoxamine Maleate  4 MG TABS Take 1 tablet (4 mg total) by mouth 3 (three) times daily as needed. 360 tablet 5  . fluticasone furoate-vilanterol (BREO ELLIPTA) 200-25 MCG/INH AEPB Inhale 1 puff into the lungs daily. 28 each 0  . oxyCODONE-acetaminophen (PERCOCET) 5-325 MG tablet Take 1-2 tablets by mouth every 6 (six) hours as needed for severe pain. Post op pain. Forearm  fracture plating 30 tablet 0   No current facility-administered medications for this visit.     No Known Allergies  I appreciate the opportunity to take part in Bob [name]'s care. Please do not hesitate to contact me with questions.  Sincerely,   R. Edgar Frisk, MD

## 2019-04-26 NOTE — Assessment & Plan Note (Signed)
   Continue appropriate allergen avoidance measures, fluticasone nasal spray as needed, carbinoxamine 4 mg every 6-8 hours if needed.  Nasal saline spray (i.e., Simply Saline) or nasal saline lavage (i.e., NeilMed) is recommended as needed and prior to medicated nasal sprays.

## 2019-04-26 NOTE — Patient Instructions (Addendum)
Moderate persistent asthma, uncomplicated Well-controlled.  Continue Breo Ellipta 200/25 g, one inhalation daily, and albuterol HFA, 1-2 inhalations every 4-6 hours as needed.  Refill prescriptions have been provided.  Subjective and objective measures of pulmonary function will be followed and the treatment plan will be adjusted accordingly.  Allergic rhinitis  Continue appropriate allergen avoidance measures, fluticasone nasal spray as needed, carbinoxamine 4 mg every 6-8 hours if needed.  Nasal saline spray (i.e., Simply Saline) or nasal saline lavage (i.e., NeilMed) is recommended as needed and prior to medicated nasal sprays.   Return in about 6 months (around 10/24/2019), or if symptoms worsen or fail to improve.

## 2019-04-26 NOTE — Assessment & Plan Note (Signed)
Well-controlled.  Continue Breo Ellipta 200/25 g, one inhalation daily, and albuterol HFA, 1-2 inhalations every 4-6 hours as needed.  Refill prescriptions have been provided.  Subjective and objective measures of pulmonary function will be followed and the treatment plan will be adjusted accordingly.

## 2019-05-18 ENCOUNTER — Other Ambulatory Visit: Payer: Self-pay | Admitting: Allergy and Immunology

## 2019-05-24 ENCOUNTER — Other Ambulatory Visit: Payer: Self-pay | Admitting: Allergy and Immunology

## 2019-07-19 IMAGING — DX LEFT ELBOW - COMPLETE 3+ VIEW
4 series · 4 of 4 positions shown · non-contrast
Comparison: None.

CLINICAL DATA: Left elbow, forearm, and wrist pain after fall while
doing yard work.

EXAM:
LEFT ELBOW - COMPLETE 3+ VIEW

[elbow ap]
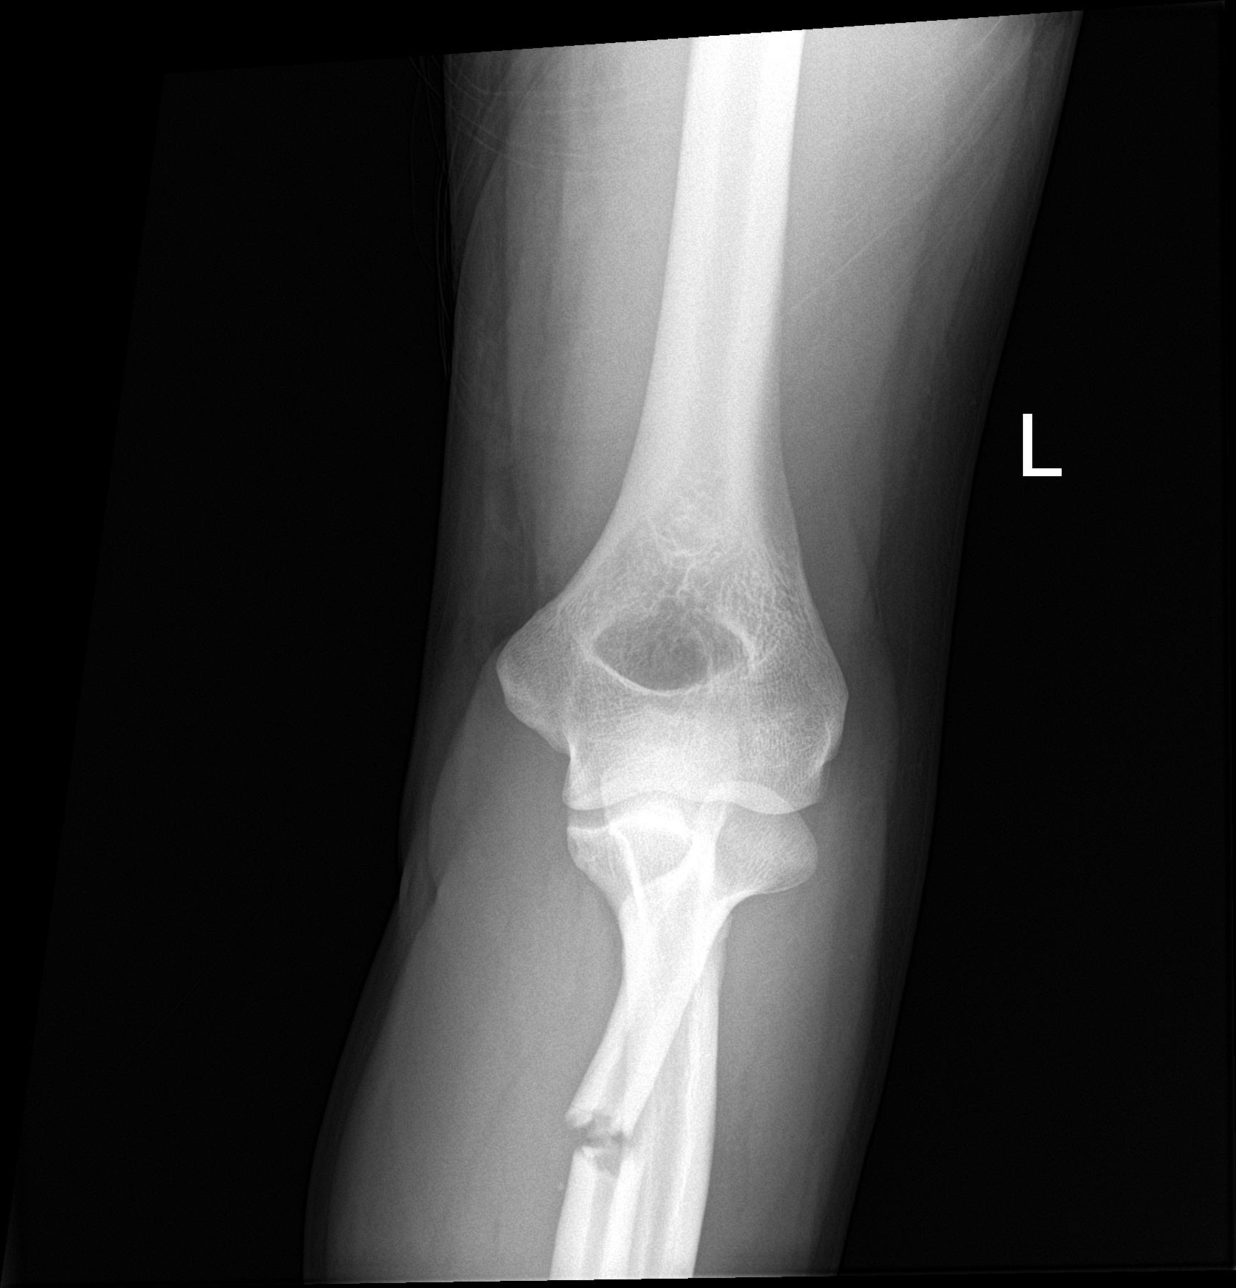

[elbow obl (1 of 2)]
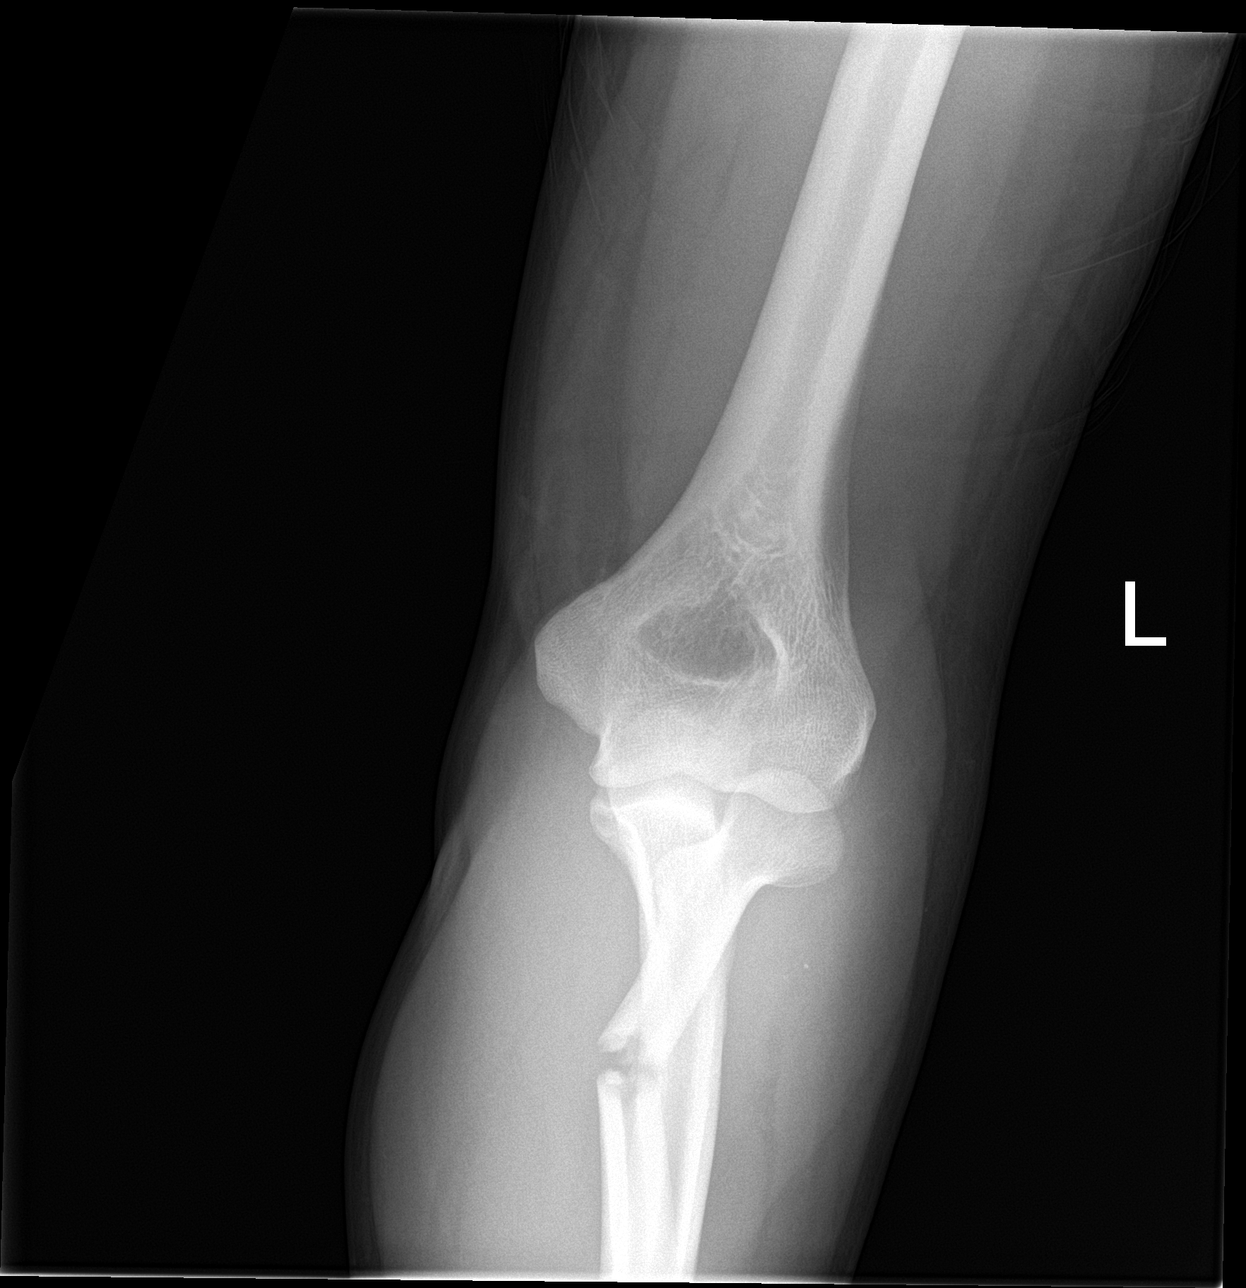

[elbow obl (2 of 2)]
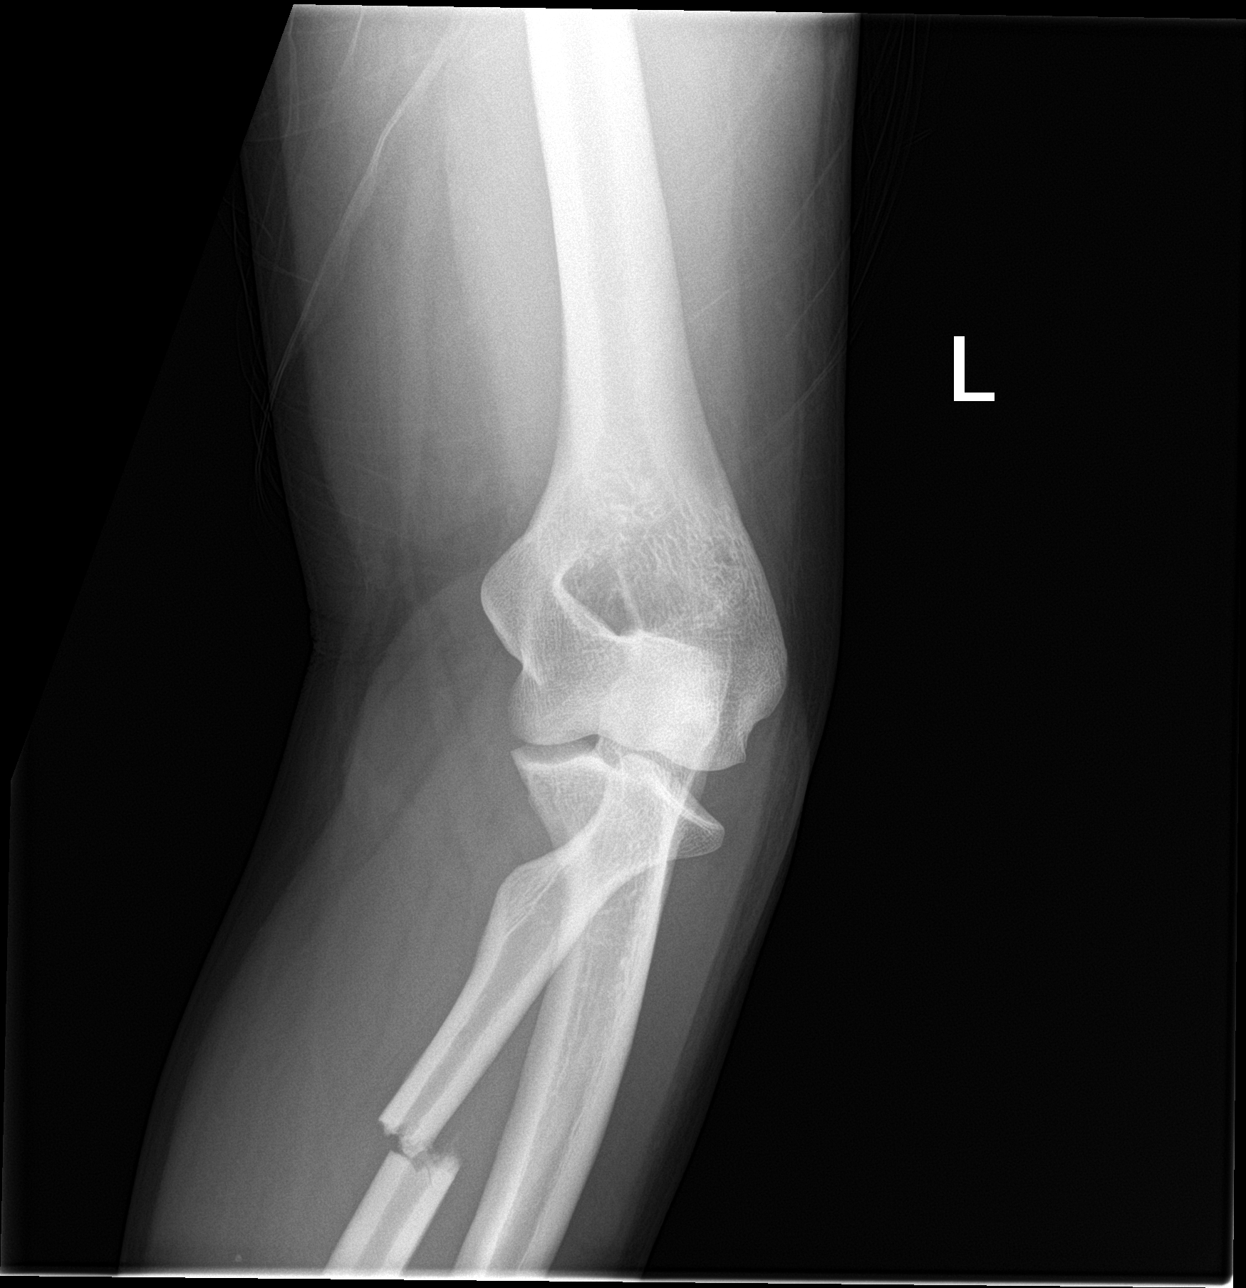

[elbow lat]
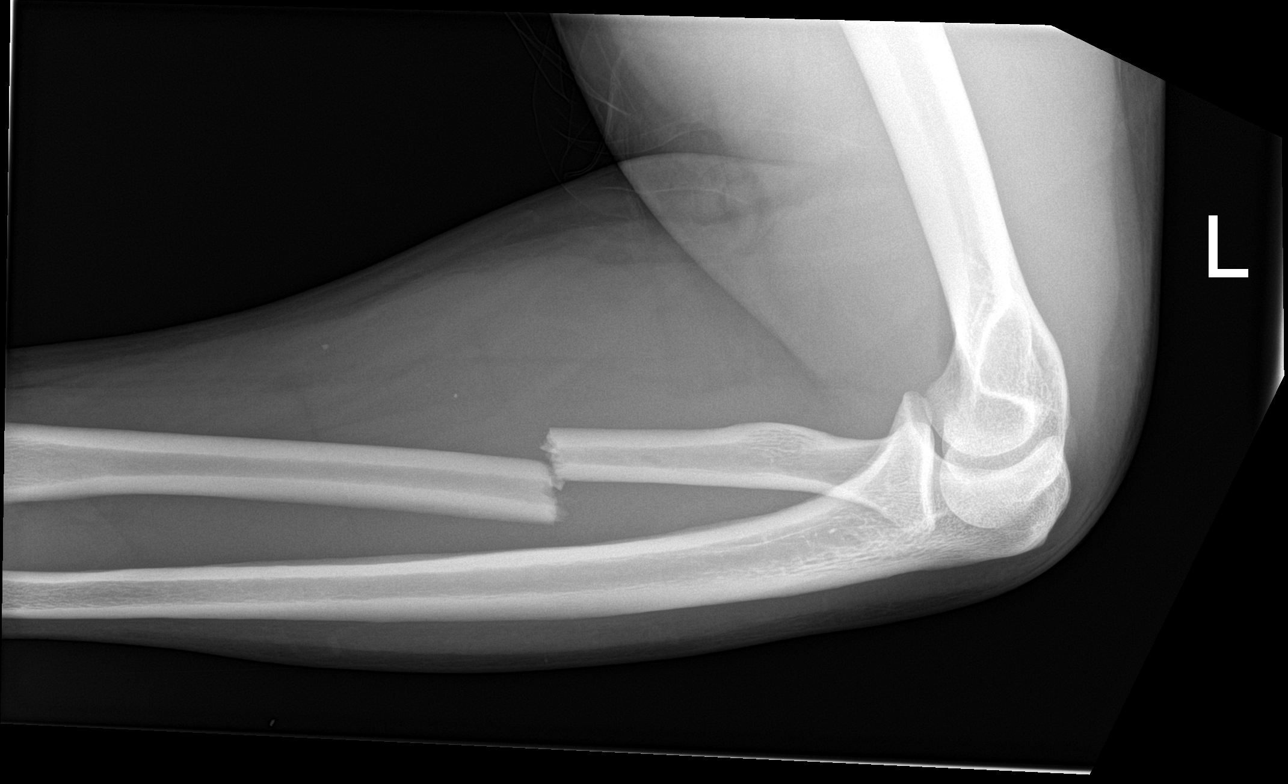

[4 of 4 positions shown; findings below may reference images not displayed]

FINDINGS: Proximal radial shaft fracture is seen on forearm radiographs.
Slight asymmetric widening angulation of the radiocapitellar joint
without frank dislocation. No elbow joint effusion. No additional
fracture of the elbow.
IMPRESSION: Proximal radial shaft fracture with slight asymmetric widening of
the radiocapitellar joint.

## 2019-07-19 IMAGING — DX LEFT SHOULDER - 2+ VIEW
3 series · 3 of 3 positions shown · non-contrast
Comparison: None.

CLINICAL DATA: Left shoulder pain after fall. Possible dislocation.

EXAM:
LEFT SHOULDER - 2+ VIEW

[shoulder grashey]
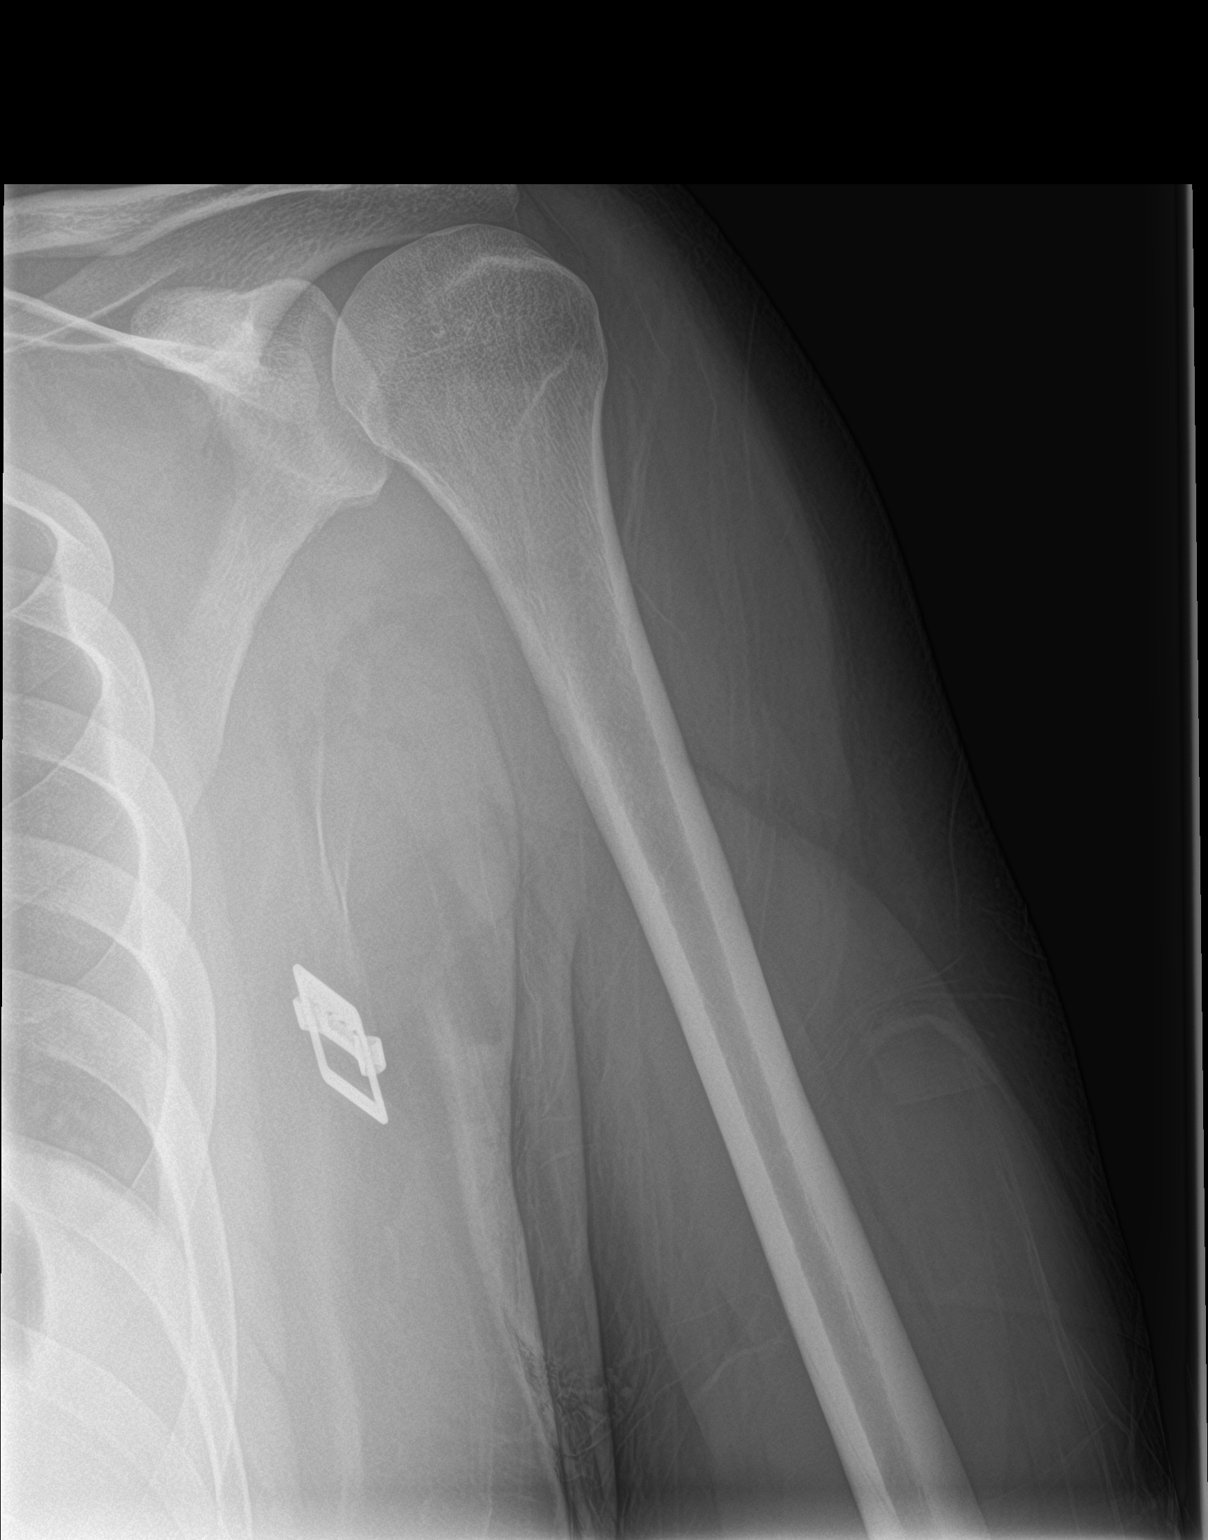

[shoulder y view]
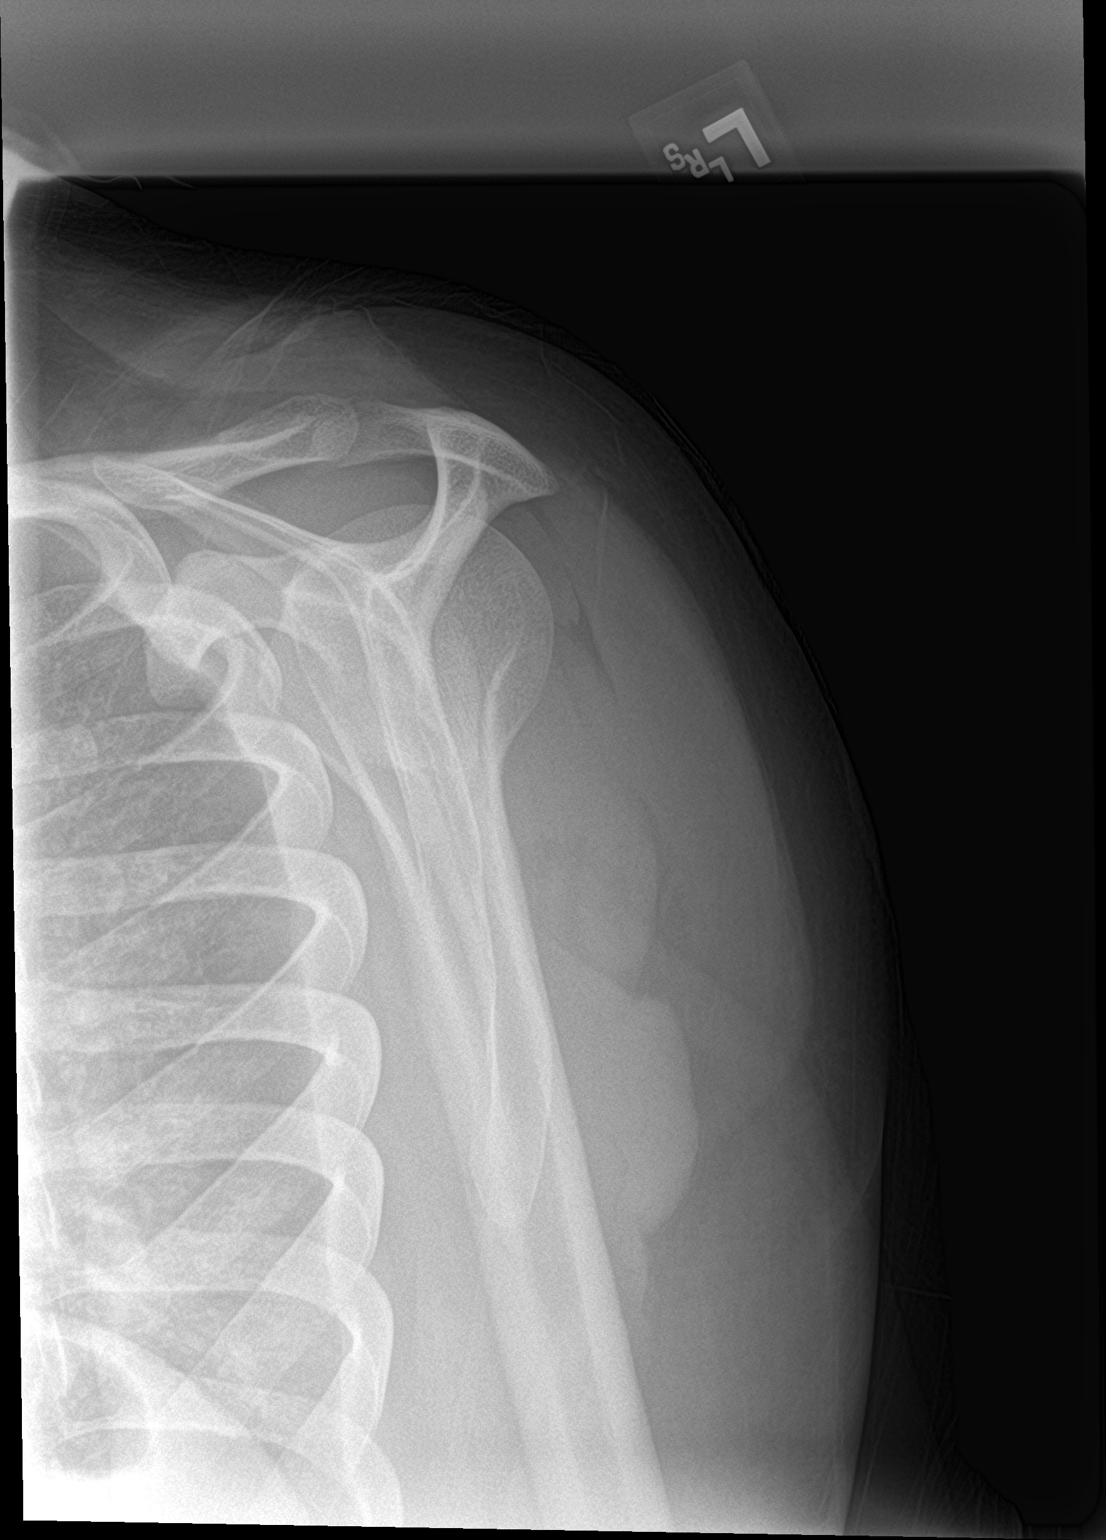

[shoulder ap neutral]
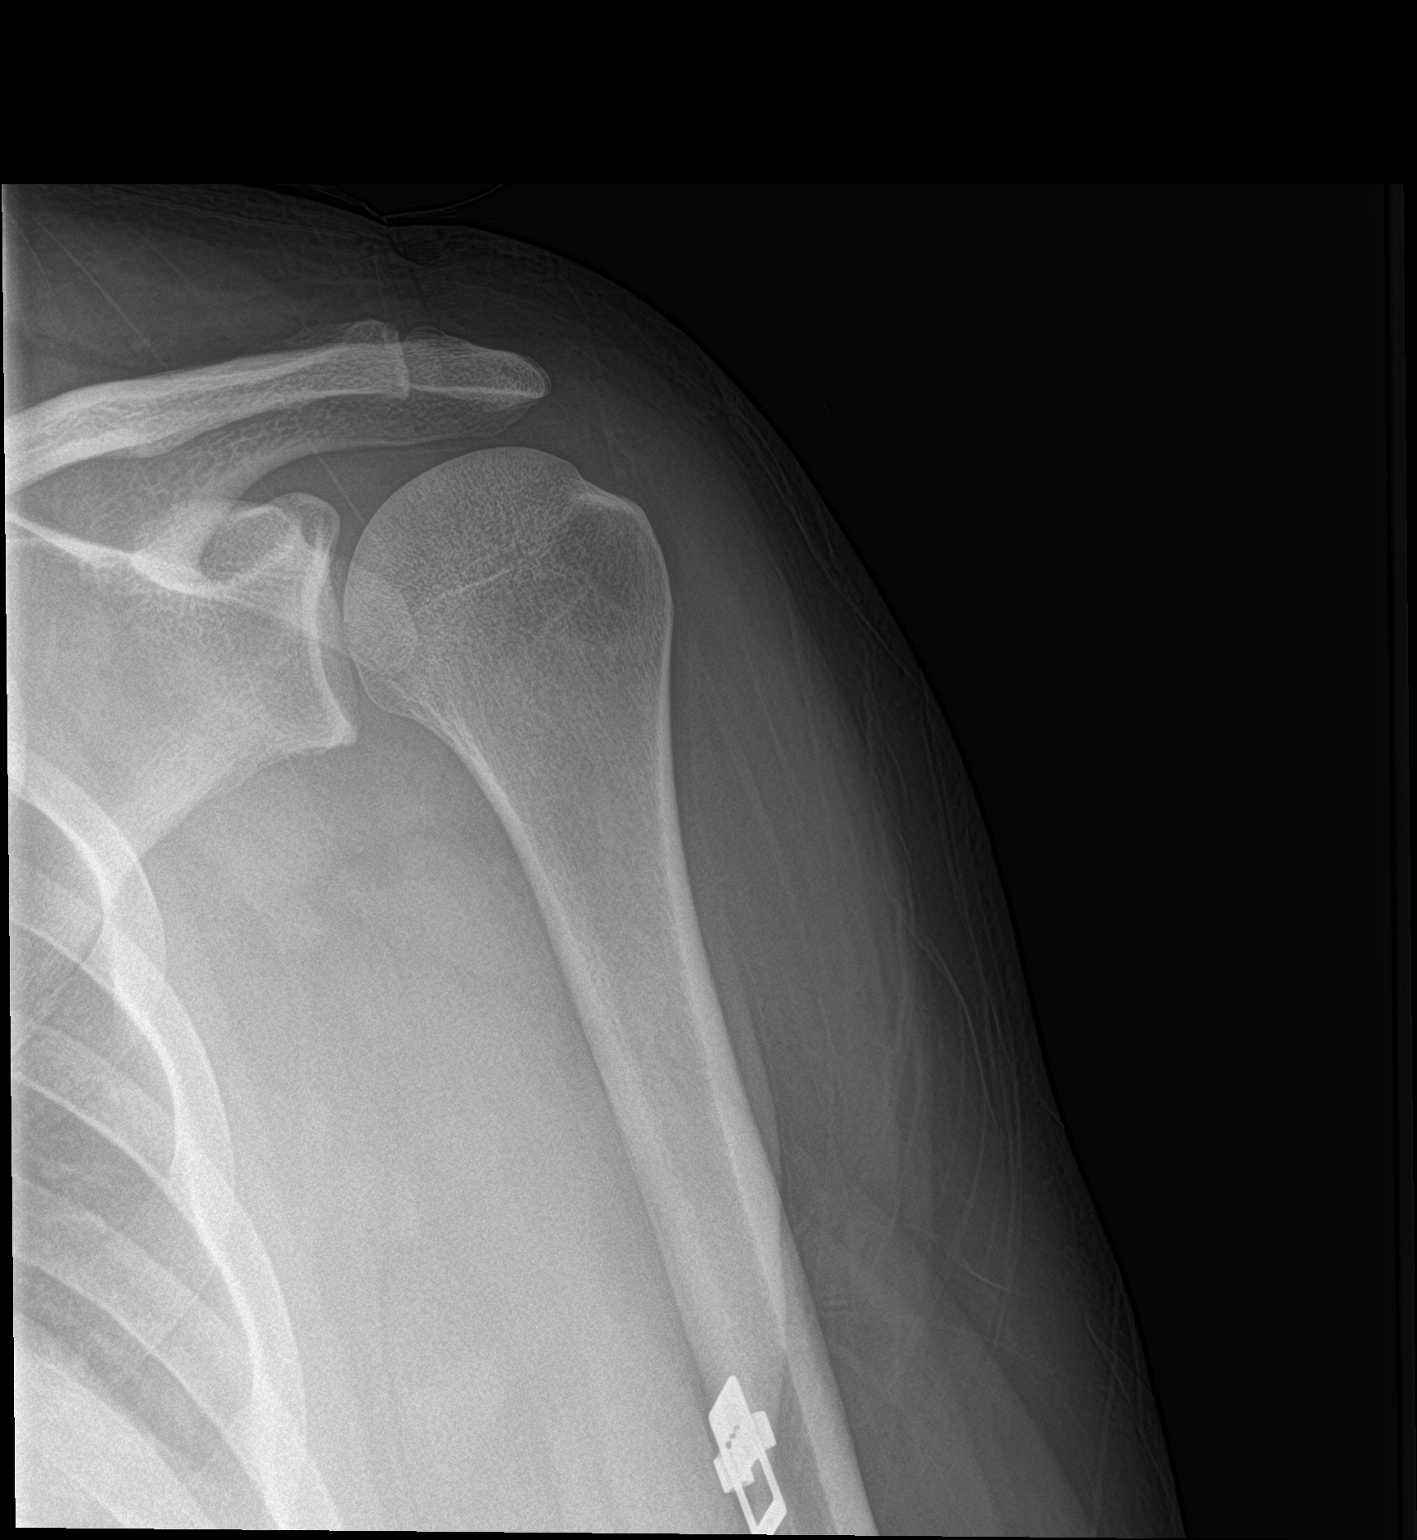

[3 of 3 positions shown; findings below may reference images not displayed]

FINDINGS: There is no evidence of fracture or dislocation. There is no
evidence of arthropathy or other focal bone abnormality. Soft
tissues are unremarkable.
IMPRESSION: No fracture or dislocation of the left shoulder.

## 2019-07-22 IMAGING — RF LEFT ELBOW - COMPLETE 3+ VIEW
1 series · 3 of 3 positions shown · non-contrast
Comparison: Preoperative radiographs 12/06/2018.

CLINICAL DATA: ORIF left radius fracture.

EXAM:
DG C-ARM 61-120 MIN; LEFT ELBOW - COMPLETE 3+ VIEW

[Series 1: run · 3 of 3 slices shown]
[im 1/3]
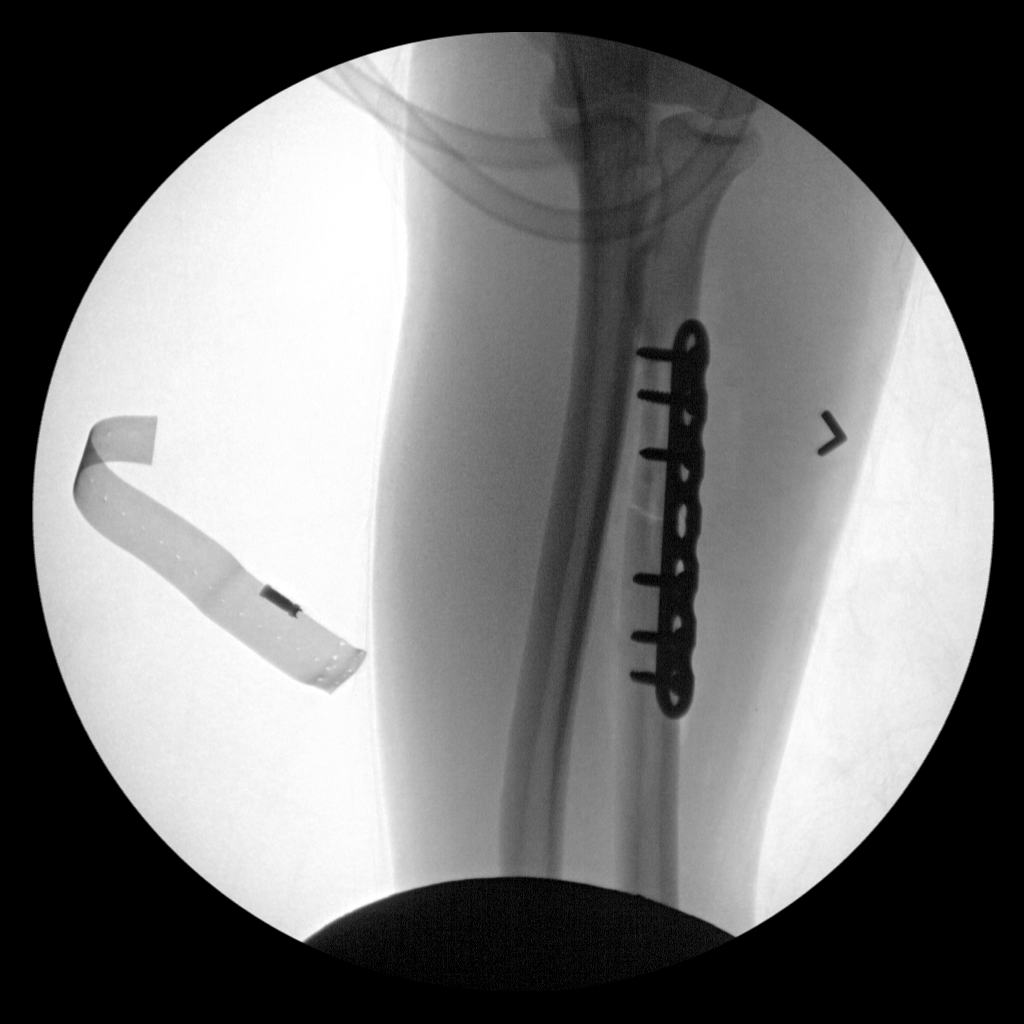
[im 2/3]
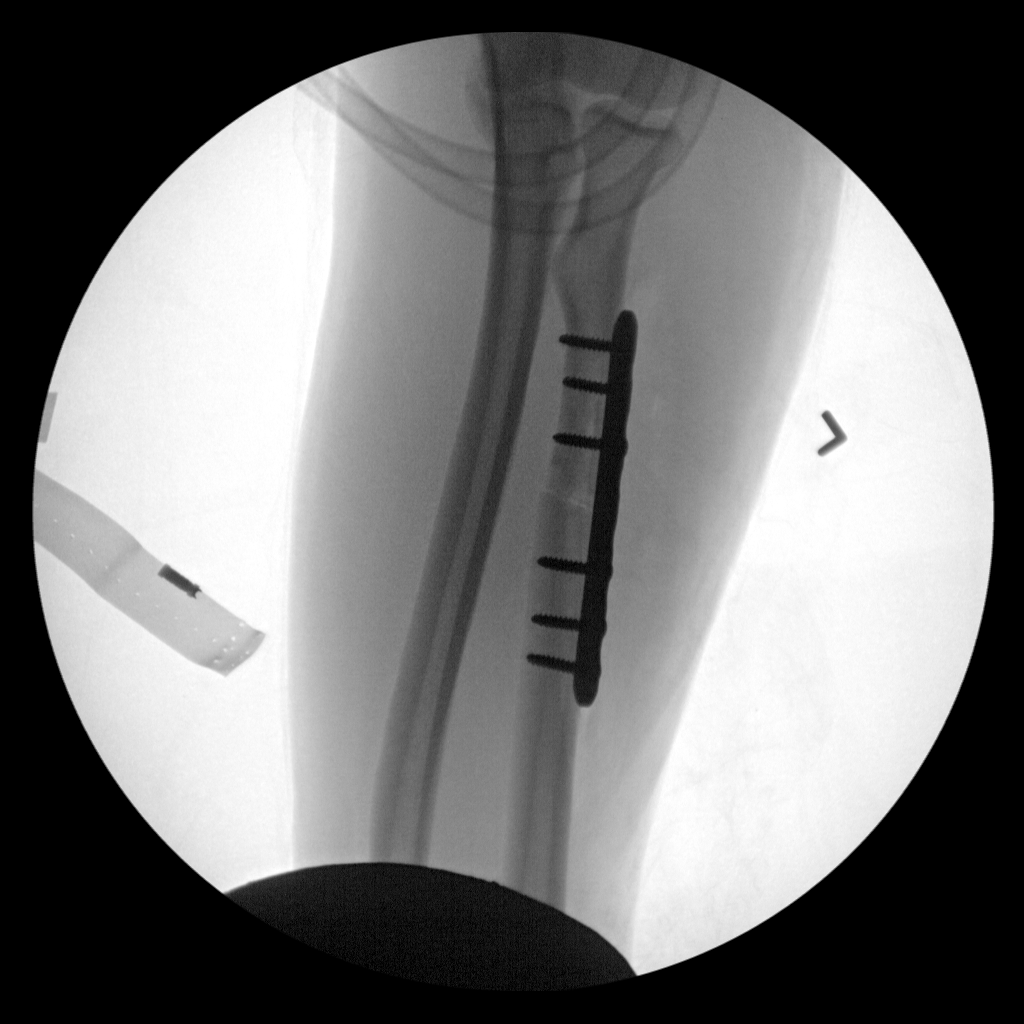
[im 3/3]
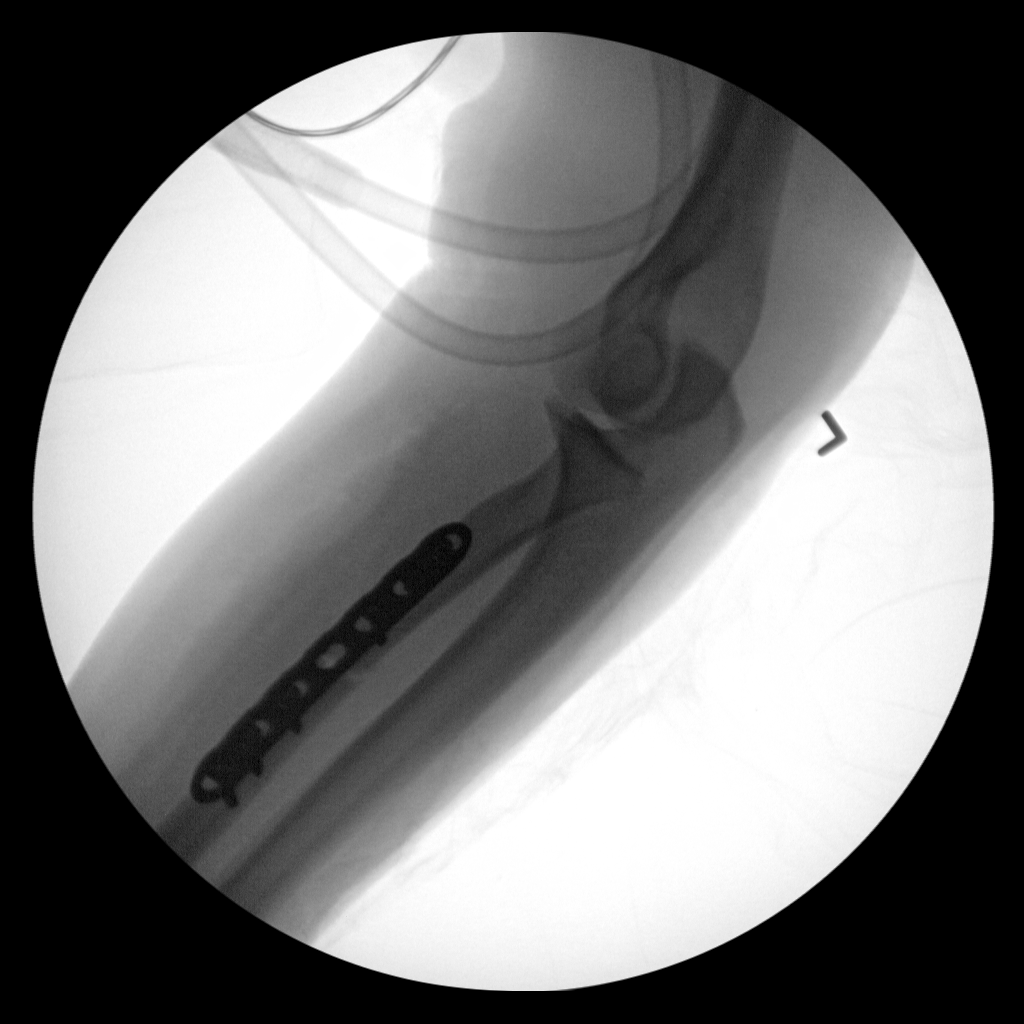

[3 of 3 positions shown; findings below may reference images not displayed]

FINDINGS: Three fluoroscopic spot images obtained in the operating room during
ORIF radial shaft fracture. Plate and multi screw fixation with
improved alignment from preoperative imaging. Total fluoroscopy time
12 seconds.
IMPRESSION: Intraoperative fluoroscopy during ORIF radial shaft fracture.

## 2019-09-15 ENCOUNTER — Other Ambulatory Visit: Payer: Self-pay | Admitting: Allergy and Immunology

## 2019-10-21 ENCOUNTER — Other Ambulatory Visit: Payer: Self-pay | Admitting: Allergy and Immunology

## 2019-11-25 ENCOUNTER — Other Ambulatory Visit: Payer: Self-pay | Admitting: Allergy and Immunology

## 2019-12-24 ENCOUNTER — Other Ambulatory Visit: Payer: Self-pay | Admitting: Allergy and Immunology

## 2019-12-30 ENCOUNTER — Telehealth: Payer: Self-pay | Admitting: Allergy and Immunology

## 2019-12-30 NOTE — Telephone Encounter (Signed)
Left message on machine for patient to call us back to set up a follow-up appointment. Courtesy refill medication was already sent in June 2021.

## 2019-12-30 NOTE — Telephone Encounter (Signed)
Patient is in need of breo refil

## 2020-01-05 ENCOUNTER — Telehealth: Payer: Self-pay | Admitting: Allergy and Immunology

## 2020-01-05 MED ORDER — BREO ELLIPTA 200-25 MCG/INH IN AEPB: 1.0000 | INHALATION_SPRAY | Freq: Every day | RESPIRATORY_TRACT | 0 refills | Status: DC

## 2020-01-05 NOTE — Telephone Encounter (Signed)
Refill sent with note to keep scheduled appointment so that future refills can be sent.

## 2020-01-05 NOTE — Telephone Encounter (Signed)
Patient has a televisit with dr bobbitt tomorrow and he needs to have [facility] [address] [phone].

## 2020-01-06 ENCOUNTER — Other Ambulatory Visit: Payer: Self-pay

## 2020-01-06 ENCOUNTER — Encounter: Payer: Self-pay | Admitting: Family

## 2020-01-06 ENCOUNTER — Ambulatory Visit (INDEPENDENT_AMBULATORY_CARE_PROVIDER_SITE_OTHER): Payer: 59 | Admitting: Family

## 2020-01-06 DIAGNOSIS — J4540 Moderate persistent asthma, uncomplicated: Secondary | ICD-10-CM

## 2020-01-06 DIAGNOSIS — J3089 Other allergic rhinitis: Secondary | ICD-10-CM | POA: Diagnosis not present

## 2020-01-06 MED ORDER — BREO ELLIPTA 200-25 MCG/INH IN AEPB: 1.0000 | INHALATION_SPRAY | Freq: Every day | RESPIRATORY_TRACT | 5 refills | Status: DC

## 2020-01-06 MED ORDER — ALBUTEROL SULFATE HFA 108 (90 BASE) MCG/ACT IN AERS: INHALATION_SPRAY | RESPIRATORY_TRACT | 1 refills | Status: DC

## 2020-01-06 MED ORDER — CARBINOXAMINE MALEATE 4 MG PO TABS: 4.0000 mg | ORAL_TABLET | Freq: Three times a day (TID) | ORAL | 3 refills | Status: DC | PRN

## 2020-01-06 NOTE — Progress Notes (Addendum)
RE: Bob COURNOYER MRN: [id number] DOB: Aug 06, 1985 Date of Telemedicine Visit: 01/06/2020  Referring provider: Eartha Inch, MD Primary care provider: Eartha Inch, MD  Chief Complaint: Asthma   Telemedicine Follow Up Visit via Telephone: I connected with Loraine Maple for a follow up on 01/06/20 by telephone and verified that I am speaking with the correct person using two identifiers.   I discussed the limitations, risks, security and privacy concerns of performing an evaluation and management service by telephone and the availability of in person appointments. I also discussed with the patient that there may be a patient responsible charge related to this service. The patient expressed understanding and agreed to proceed.  Patient is at home.  Provider is at the office.  Visit start time: 4:28 pm Visit end time: 4:40 pm Insurance consent/check in by: Thornell Mule. Medical consent and medical assistant/nurse: Madison B.  History of Present Illness: He is a 34 y.o. male, who is being followed for moderate persistent asthma and allergic rhinitis. His previous allergy office visit was on 04/26/2019 with Dr. Nunzio Cobbs.   Moderate persistent asthma is reported as moderately controlled with the use of Breo 200 mcg 1 puff once a day and albuterol as needed.  He reports an occasional cough in the morning that is productive with clear sputum.  He reports that this is been going on since having Covid in June.  He denies any wheezing, tightness in his chest, shortness of breath, and nocturnal awakenings.  He has had to use his albuterol approximately 3 times in the past couple months.  He has not required any systemic steroids or trips to the emergency room or urgent care since his last office visit.  Allergic rhinitis is reported as controlled with the use of carbinoxamine as needed.  He is not currently using any Flonase nasal spray or saline rinses.  He denies any rhinorrhea, nasal  congestion, postnasal drip and ocular pruritus.  He reports that he had rhinorrhea for the first time this past spring and used carbinoxamine with relief.  Current medications are as listed in the chart.  Assessment and Plan: Bob [name] is a 34 y.o. male with: Patient Instructions  Moderate persistent asthma Continue Breo 200 mcg- 1 puff once a day to help prevent cough and wheeze Continue albuterol 2 puffs every 4 hours as needed for cough, wheeze, tightness in chest, or shortness of breath. Asthma control goals:   Full participation in all desired activities (may need albuterol before activity)  Albuterol use two time or less a week on average (not counting use with activity)  Cough interfering with sleep two time or less a month  Oral steroids no more than once a year  No hospitalizations  Allergic rhinitis Continue fluticasone nasal spray 2 sprays each nostril once a day as needed for stuffy nose Continue carbinoxamine 4 mg every 6-8 hours as needed Consider saline nasal spray or saline sinus rinse as needed for nasal symptoms. Use this prior to any medicated nasal sprays.  Please let us know if this treatment plan is not working well for you. Schedule follow up appointment in 4 months with spirometry   Return in about 4 months (around 05/07/2020), or if symptoms worsen or fail to improve.  Meds ordered this encounter  Medications  . fluticasone furoate-vilanterol (BREO ELLIPTA) 200-25 MCG/INH AEPB    Sig: Inhale 1 puff into the lungs daily. Rinse, gargle and spit out after use.    Dispense:  30 each  Refill:  5  . Carbinoxamine Maleate 4 MG TABS    Sig: Take 1 tablet (4 mg total) by mouth 3 (three) times daily as needed.    Dispense:  90 tablet    Refill:  3  . albuterol (VENTOLIN HFA) 108 (90 Base) MCG/ACT inhaler    Sig: INHALE 1 TO 2 INHALATIONS EVERY 4 HOURS AS NEEDED FOR WHEEZE OR SHORTNESS OF BREATH    Dispense:  18 g    Refill:  1   Lab Orders  No laboratory  test(s) ordered today    Diagnostics: None.  Medication List:  Current Outpatient Medications  Medication Sig Dispense Refill  . albuterol (VENTOLIN HFA) 108 (90 Base) MCG/ACT inhaler INHALE 1 TO 2 INHALATIONS EVERY 4 HOURS AS NEEDED FOR WHEEZE OR SHORTNESS OF BREATH 18 g 1  . Carbinoxamine Maleate 4 MG TABS Take 1 tablet (4 mg total) by mouth 3 (three) times daily as needed. 90 tablet 3  . fluticasone furoate-vilanterol (BREO ELLIPTA) 200-25 MCG/INH AEPB Inhale 1 puff into the lungs daily. Rinse, gargle and spit out after use. 30 each 5  . allopurinol (ZYLOPRIM) 300 MG tablet START 1 TABLET DAILY FOR 1 WEEK AFTER YOU HAVE BEEN ON ANTI-INFLAMMATORIES REGULARLY FOR A FULL WEEK. AFTER THE FIRST WEEK GO TO 2 TABLETS DAILY FOR A WEEK AND THEN YOU CAN GO TO 3 TABLETS DAILY. (Patient not taking: Reported on 01/06/2020) 90 tablet 0   No current facility-administered medications for this visit.   Allergies: No Known Allergies I reviewed his past medical history, social history, family history, and environmental history and no significant changes have been reported from previous visit on11/23/2020.  Review of Systems  Constitutional: Negative for chills and fever.  HENT: Negative for congestion, postnasal drip and rhinorrhea.   Eyes:       Denies itchy/watery eyes  Respiratory: Positive for cough. Negative for chest tightness, shortness of breath and wheezing.   Cardiovascular: Negative for chest pain and palpitations.  Gastrointestinal: Negative for abdominal pain.  Genitourinary: Negative for dysuria.  Skin: Negative for rash.  Allergic/Immunologic: Positive for environmental allergies.  Neurological: Negative for headaches.   Objective: Physical Exam Not obtained as encounter was done via telephone.   Previous notes and tests were reviewed.  I discussed the assessment and treatment plan with the patient. The patient was provided an opportunity to ask questions and all were answered.  The patient agreed with the plan and demonstrated an understanding of the instructions.   The patient was advised to call back or seek an in-person evaluation if the symptoms worsen or if the condition fails to improve as anticipated.  I provided 12 minutes of non-face-to-face time during this encounter.  It was my pleasure to participate in Monty Spicher care today. Please feel free to contact me with any questions or concerns.   Sincerely,  Nehemiah Settle, FNP  ________________________________________________  I have provided oversight concerning Gaia Gullikson's evaluation and treatment of this patient's health issues addressed during today's encounter.  I agree with the assessment and therapeutic plan as outlined in the note.   Signed,   R Jorene Guest, MD

## 2020-01-06 NOTE — Addendum Note (Signed)
Addended by: Candis Schatz C on: 01/06/2020 11:19 PM   Modules accepted: Level of Service

## 2020-01-06 NOTE — Patient Instructions (Signed)
Moderate persistent asthma Continue Breo 200 mcg- 1 puff once a day to help prevent cough and wheeze Continue albuterol 2 puffs every 4 hours as needed for cough, wheeze, tightness in chest, or shortness of breath. Asthma control goals:   Full participation in all desired activities (may need albuterol before activity)  Albuterol use two time or less a week on average (not counting use with activity)  Cough interfering with sleep two time or less a month  Oral steroids no more than once a year  No hospitalizations  Allergic rhinitis Continue fluticasone nasal spray 2 sprays each nostril once a day as needed for stuffy nose Continue carbinoxamine 4 mg every 6-8 hours as needed Consider saline nasal spray or saline sinus rinse as needed for nasal symptoms. Use this prior to any medicated nasal sprays.  Please let us know if this treatment plan is not working well for you. Schedule follow up appointment in 4 months with spirometry

## 2020-01-25 ENCOUNTER — Other Ambulatory Visit: Payer: Self-pay | Admitting: Orthopaedic Surgery

## 2020-01-25 MED ORDER — MELOXICAM 7.5 MG PO TABS: 7.5000 mg | ORAL_TABLET | Freq: Every day | ORAL | 4 refills | Status: DC

## 2020-01-25 NOTE — Progress Notes (Signed)
Pt called requested [facility]ill[facility]

## 2020-03-12 ENCOUNTER — Other Ambulatory Visit: Payer: Self-pay | Admitting: Family

## 2020-03-13 NOTE — Telephone Encounter (Signed)
Can someone call and see how often he is needing his albuterol inhaler. It looks like in August that we sent an rx for albuterol with 1 refill.  Thank you Wynona Canes

## 2020-05-09 ENCOUNTER — Ambulatory Visit: Payer: 59 | Admitting: Allergy and Immunology

## 2020-07-12 ENCOUNTER — Other Ambulatory Visit: Payer: Self-pay | Admitting: Orthopaedic Surgery

## 2020-07-12 ENCOUNTER — Other Ambulatory Visit: Payer: Self-pay | Admitting: Family

## 2020-07-12 NOTE — Telephone Encounter (Signed)
Please advise 

## 2020-07-12 NOTE — Telephone Encounter (Signed)
Patient has not been seen since August 2020.  Needs R OV with Dr. Ophelia Charter.

## 2020-08-12 ENCOUNTER — Other Ambulatory Visit: Payer: Self-pay | Admitting: Family

## 2020-08-13 NOTE — Telephone Encounter (Signed)
His last appointment was in August 2021. He needs to schedule a follow up appointment. May send in courtesy refill. Thank you!

## 2020-09-29 ENCOUNTER — Telehealth: Payer: Self-pay | Admitting: Family Medicine

## 2020-09-29 MED ORDER — BREO ELLIPTA 200-25 MCG/INH IN AEPB: 1.0000 | INHALATION_SPRAY | Freq: Every day | RESPIRATORY_TRACT | 0 refills | Status: DC

## 2020-09-29 NOTE — Telephone Encounter (Signed)
Patient's wife called and made an appointment for a televisit with Anne on 10/05/20. He is out of his Virgel Bouquet and wife is requesting a refill to get him through. Last saw Chrissie 01/06/20. [facility]

## 2020-09-29 NOTE — Telephone Encounter (Signed)
Called and left a message for patient to inform him that a courtesy refill has been sent to the [place] [facility] in [place]

## 2020-09-30 ENCOUNTER — Other Ambulatory Visit: Payer: Self-pay | Admitting: Orthopaedic Surgery

## 2020-10-05 ENCOUNTER — Ambulatory Visit (INDEPENDENT_AMBULATORY_CARE_PROVIDER_SITE_OTHER): Payer: 59 | Admitting: Family Medicine

## 2020-10-05 ENCOUNTER — Other Ambulatory Visit: Payer: Self-pay

## 2020-10-05 ENCOUNTER — Encounter: Payer: Self-pay | Admitting: Family Medicine

## 2020-10-05 DIAGNOSIS — J4540 Moderate persistent asthma, uncomplicated: Secondary | ICD-10-CM

## 2020-10-05 DIAGNOSIS — J3089 Other allergic rhinitis: Secondary | ICD-10-CM

## 2020-10-05 MED ORDER — ALBUTEROL SULFATE HFA 108 (90 BASE) MCG/ACT IN AERS: 2.0000 | INHALATION_SPRAY | RESPIRATORY_TRACT | 1 refills | Status: DC | PRN

## 2020-10-05 MED ORDER — BREO ELLIPTA 200-25 MCG/INH IN AEPB
1.0000 | INHALATION_SPRAY | Freq: Every day | RESPIRATORY_TRACT | 5 refills | Status: DC
Start: 2020-10-05 — End: 2021-04-12

## 2020-10-05 NOTE — Progress Notes (Signed)
RE: Bob [name] MRN: [id number] DOB: 1985-07-18 Date of Telemedicine Visit: 10/05/2020  Referring provider: Eartha Inch, MD Primary care provider: Eartha Inch, MD  Chief Complaint: Asthma (Patient gave verbal consent to treat and bill insurance for this visit.)   Telemedicine Follow Up Visit via Telephone: I connected with Bob [name] for a follow up on 10/05/20 by telephone and verified that I am speaking with the correct person using two identifiers.   I discussed the limitations, risks, security and privacy concerns of performing an evaluation and management service by telephone and the availability of in person appointments. I also discussed with the patient that there may be a patient responsible charge related to this service. The patient expressed understanding and agreed to proceed.  Patient is at home  Provider is at the office.  Visit start time: 1134 Visit end time: 1203 Insurance consent/check in by: [facility] Medical consent and medical assistant/nurse: Bob [name]  History of Present Illness: He is a 35 y.o. male, who is being followed for Bob [name] allergic rhinitis. His previous allergy office visit was on 01/06/2020 with Bob Settle, FNP.  At today's visit, he reports his asthma has been well controlled with no shortness of breath or wheeze with activity or rest.  He does report cough producing mucus that started about 1 week ago and is beginning to resolve at this time.  He continues Breo-1 puff once a day and uses albuterol about once a month with relief of symptoms.  Allergic rhinitis is reported as moderately well controlled with occasional clear rhinorrhea and occasional sneezing occurring mostly in the spring.  He is not currently using any medical intervention, however, he has in the past he used Flonase, saline nasal rinses, and carbinoxamine.  His current medications are listed in the chart.   Assessment and Plan: Bob [name] is a 35 y.o. male with: Patient  Instructions  Asthma Continue Breo 200 mcg- 1 puff once a day to prevent cough and wheeze Continue albuterol 2 puffs every 4 hours as needed for cough, wheeze, tightness in chest, or shortness of breath. You my use albuterol 2 puffs 5-15 minutes before activity to decrease cough or wheeze Asthma control goals:   Full participation in all desired activities (may need albuterol before activity)  Albuterol use two time or less a week on average (not counting use with activity)  Cough interfering with sleep two time or less a month  Oral steroids no more than once a year  No hospitalizations  Allergic rhinitis Continue carbinoxamine 4 mg every 6-8 hours as needed for nasal symptoms Continue fluticasone nasal spray 2 sprays each nostril once a day as needed for a stuffy nose. In the right nostril, point the applicator out toward the right ear. In the left nostril, point the applicator out toward the left ear Consider saline nasal spray or saline sinus rinse as needed for nasal symptoms. Use this prior to any medicated nasal sprays.  Call the clinic if this treatment plan is not working well for you  Follow up in the clinic in 6 months or sooner if needed.    Return in about 6 months (around 04/07/2021), or if symptoms worsen or fail to improve.  Meds ordered this encounter  Medications  . fluticasone furoate-vilanterol (BREO ELLIPTA) 200-25 MCG/INH AEPB    Sig: Inhale 1 puff into the lungs daily. Rinse, gargle and spit out after use.    Dispense:  28 each    Refill:  5  .  albuterol (VENTOLIN HFA) 108 (90 Base) MCG/ACT inhaler    Sig: Inhale 2 puffs into the lungs every 4 (four) hours as needed for wheezing or shortness of breath.    Dispense:  18 each    Refill:  1    Medication List:  Current Outpatient Medications  Medication Sig Dispense Refill  . Carbinoxamine Maleate 4 MG TABS Take 1 tablet (4 mg total) by mouth 3 (three) times daily as needed. 90 tablet 3  . meloxicam  (MOBIC) 7.5 MG tablet TAKE 1 TABLET BY MOUTH EVERY DAY 30 tablet 4  . albuterol (VENTOLIN HFA) 108 (90 Base) MCG/ACT inhaler Inhale 2 puffs into the lungs every 4 (four) hours as needed for wheezing or shortness of breath. 18 each 1  . allopurinol (ZYLOPRIM) 300 MG tablet START 1 TABLET DAILY FOR 1 WEEK AFTER YOU HAVE BEEN ON ANTI-INFLAMMATORIES REGULARLY FOR A FULL WEEK. AFTER THE FIRST WEEK GO TO 2 TABLETS DAILY FOR A WEEK AND THEN YOU CAN GO TO 3 TABLETS DAILY. (Patient not taking: No sig reported) 90 tablet 0  . fluticasone furoate-vilanterol (BREO ELLIPTA) 200-25 MCG/INH AEPB Inhale 1 puff into the lungs daily. Rinse, gargle and spit out after use. 28 each 5   No current facility-administered medications for this visit.   Allergies: No Known Allergies I reviewed his past medical history, social history, family history, and environmental history and no significant changes have been reported from previous visit on 01/06/2020.  Objective: Physical Exam Not obtained as encounter was done via telephone.   Previous notes and tests were reviewed.  I discussed the assessment and treatment plan with the patient. The patient was provided an opportunity to ask questions and all were answered. The patient agreed with the plan and demonstrated an understanding of the instructions.   The patient was advised to call back or seek an in-person evaluation if the symptoms worsen or if the condition fails to improve as anticipated.  I provided 29 minutes of non-face-to-face time during this encounter.  It was my pleasure to participate in Bob [name] care today. Please feel free to contact me with any questions or concerns.   Sincerely,  [name], FNP

## 2020-10-05 NOTE — Patient Instructions (Signed)
Asthma Continue Breo 200 mcg- 1 puff once a day to prevent cough and wheeze Continue albuterol 2 puffs every 4 hours as needed for cough, wheeze, tightness in chest, or shortness of breath. You my use albuterol 2 puffs 5-15 minutes before activity to decrease cough or wheeze Asthma control goals:   Full participation in all desired activities (may need albuterol before activity)  Albuterol use two time or less a week on average (not counting use with activity)  Cough interfering with sleep two time or less a month  Oral steroids no more than once a year  No hospitalizations  Allergic rhinitis Continue carbinoxamine 4 mg every 6-8 hours as needed for nasal symptoms Continue fluticasone nasal spray 2 sprays each nostril once a day as needed for a stuffy nose. In the right nostril, point the applicator out toward the right ear. In the left nostril, point the applicator out toward the left ear Consider saline nasal spray or saline sinus rinse as needed for nasal symptoms. Use this prior to any medicated nasal sprays.  Call the clinic if this treatment plan is not working well for you  Follow up in the clinic in 6 months or sooner if needed.

## 2020-11-19 ENCOUNTER — Other Ambulatory Visit: Payer: Self-pay | Admitting: Family Medicine

## 2020-11-20 ENCOUNTER — Other Ambulatory Visit: Payer: Self-pay | Admitting: Family Medicine

## 2020-11-21 NOTE — Telephone Encounter (Signed)
Thurston Hole please advise? Virgel Bouquet is T3 and not covered. AirDuo is T2 on formulary.

## 2020-11-22 NOTE — Telephone Encounter (Signed)
Yes! I was very confused at first as well!

## 2020-11-22 NOTE — Telephone Encounter (Signed)
I am so confused about this message. Is the pharmacy making a recommendation for airduo 232 because breo ellipta is not covered?

## 2020-11-24 ENCOUNTER — Telehealth: Payer: Self-pay | Admitting: Family Medicine

## 2020-11-24 NOTE — Telephone Encounter (Signed)
Left a detailed message informing patient to contact the pharmacy for refills. Script was sent in on 10/05/2020 with 5 additional refills, so he should have some on file. Informed him to contact the office if he has an problems.

## 2020-11-24 NOTE — Telephone Encounter (Signed)
LMOM for patient to call the clinic to resolve the issue of his Breo vs AirDuo. Which one is he taking and is it covered by his insurance?

## 2020-11-24 NOTE — Telephone Encounter (Signed)
Patient is requesting a refill for his Breo prescription to be sent in to the [facility] on [address].

## 2021-03-06 ENCOUNTER — Other Ambulatory Visit: Payer: Self-pay | Admitting: Orthopaedic Surgery

## 2021-03-06 NOTE — Telephone Encounter (Signed)
Pease advise.

## 2021-04-09 ENCOUNTER — Ambulatory Visit: Payer: 59 | Admitting: Allergy

## 2021-04-09 DIAGNOSIS — J309 Allergic rhinitis, unspecified: Secondary | ICD-10-CM

## 2021-04-09 NOTE — Progress Notes (Deleted)
Follow Up Note  RE: Bob [name] MRN: [id number] DOB: Mar 10, 1986 Date of Office Visit: 04/09/2021  Referring provider: Eartha Inch, MD Primary care provider: Eartha Inch, MD  Chief Complaint: No chief complaint on file.  History of Present Illness: I had the pleasure of seeing Bob [name] for a follow up visit at the [facility] on 04/09/2021. He is a 35 y.o. male, who is being followed for asthma and allergic rhinitis. His previous allergy office visit was on 10/05/2020 with [name], FNP via telemedicine. Today is a regular follow up visit.  Asthma Continue Breo 200 mcg- 1 puff once a day to prevent cough and wheeze Continue albuterol 2 puffs every 4 hours as needed for cough, wheeze, tightness in chest, or shortness of breath. You my use albuterol 2 puffs 5-15 minutes before activity to decrease cough or wheeze Asthma control goals:  Full participation in all desired activities (may need albuterol before activity) Albuterol use two time or less a week on average (not counting use with activity) Cough interfering with sleep two time or less a month Oral steroids no more than once a year No hospitalizations   Allergic rhinitis Continue carbinoxamine 4 mg every 6-8 hours as needed for nasal symptoms Continue fluticasone nasal spray 2 sprays each nostril once a day as needed for a stuffy nose. In the right nostril, point the applicator out toward the right ear. In the left nostril, point the applicator out toward the left ear Consider saline nasal spray or saline sinus rinse as needed for nasal symptoms. Use this prior to any medicated nasal sprays.   Call the clinic if this treatment plan is not working well for you    positive several years ago to grass pollen, dog, cat, and dust mite antigen.  Assessment and Plan: Bob [name] is a 35 y.o. male with: No problem-specific Assessment & Plan notes found for this encounter.  No follow-ups on  file.  No orders of the defined types were placed in this encounter.  Lab Orders  No laboratory test(s) ordered today    Diagnostics: Spirometry:  Tracings reviewed. His effort: {Blank single:19197::"Good reproducible efforts.","It was hard to get consistent efforts and there is a question as to whether this reflects a maximal maneuver.","Poor effort, data can not be interpreted."} FVC: ***L FEV1: ***L, ***% predicted FEV1/FVC ratio: ***% Interpretation: {Blank single:19197::"Spirometry consistent with mild obstructive disease","Spirometry consistent with moderate obstructive disease","Spirometry consistent with severe obstructive disease","Spirometry consistent with possible restrictive disease","Spirometry consistent with mixed obstructive and restrictive disease","Spirometry uninterpretable due to technique","Spirometry consistent with normal pattern","No overt abnormalities noted given today's efforts"}.  Please see scanned spirometry results for details.  Skin Testing: {Blank single:19197::"Select foods","Environmental allergy panel","Environmental allergy panel and select foods","Food allergy panel","None","Deferred due to recent antihistamines use"}. *** Results discussed with patient/family.   Medication List:  Current Outpatient Medications  Medication Sig Dispense Refill   albuterol (VENTOLIN HFA) 108 (90 Base) MCG/ACT inhaler Inhale 2 puffs into the lungs every 4 (four) hours as needed for wheezing or shortness of breath. 18 each 1   allopurinol (ZYLOPRIM) 300 MG tablet START 1 TABLET DAILY FOR 1 WEEK AFTER YOU HAVE BEEN ON ANTI-INFLAMMATORIES REGULARLY FOR A FULL WEEK. AFTER THE FIRST WEEK GO TO 2 TABLETS DAILY FOR A WEEK AND THEN YOU CAN GO TO 3 TABLETS DAILY. (Patient not taking: No sig reported) 90 tablet 0   Carbinoxamine Maleate 4 MG TABS Take 1 tablet (4 mg total) by mouth  3 (three) times daily as needed. 90 tablet 3   fluticasone furoate-vilanterol (BREO ELLIPTA) 200-25  MCG/INH AEPB Inhale 1 puff into the lungs daily. Rinse, gargle and spit out after use. 28 each 5   meloxicam (MOBIC) 7.5 MG tablet TAKE 1 TABLET BY MOUTH EVERY DAY 30 tablet 4   No current facility-administered medications for this visit.   Allergies: No Known Allergies I reviewed his past medical history, social history, family history, and environmental history and no significant changes have been reported from his previous visit.  Review of Systems  Constitutional:  Negative for appetite change, chills, fever and unexpected weight change.  HENT:  Negative for congestion and rhinorrhea.   Eyes:  Negative for itching.  Respiratory:  Negative for cough, chest tightness, shortness of breath and wheezing.   Gastrointestinal:  Negative for abdominal pain.  Skin:  Negative for rash.  Allergic/Immunologic: Positive for environmental allergies.  Neurological:  Negative for headaches.   Objective: There were no vitals taken for this visit. There is no height or weight on file to calculate BMI. Physical Exam Vitals and nursing note reviewed.  Constitutional:      Appearance: Normal appearance. He is well-developed.  HENT:     Head: Normocephalic and atraumatic.     Right Ear: Tympanic membrane and external ear normal.     Left Ear: Tympanic membrane and external ear normal.     Nose: Nose normal.     Mouth/Throat:     Mouth: Mucous membranes are moist.     Pharynx: Oropharynx is clear.  Eyes:     Conjunctiva/sclera: Conjunctivae normal.  Cardiovascular:     Rate and Rhythm: Normal rate and regular rhythm.     Heart sounds: Normal heart sounds. No murmur heard. Pulmonary:     Effort: Pulmonary effort is normal.     Breath sounds: Normal breath sounds. No wheezing, rhonchi or rales.  Musculoskeletal:     Cervical back: Neck supple.  Skin:    General: Skin is warm.     Findings: No rash.  Neurological:     Mental Status: He is alert and oriented to person, place, and time.   Psychiatric:        Behavior: Behavior normal.   Previous notes and tests were reviewed. The plan was reviewed with the patient/family, and all questions/concerned were addressed.  It was my pleasure to see Bob [name] today and participate in his care. Please feel free to contact me with any questions or concerns.  Sincerely,  Wyline Mood, DO Allergy & Immunology  [facility] office: [phone] [facility] office: [phone]

## 2021-04-11 ENCOUNTER — Other Ambulatory Visit: Payer: Self-pay | Admitting: Family Medicine

## 2021-05-02 ENCOUNTER — Other Ambulatory Visit: Payer: Self-pay | Admitting: Family Medicine

## 2021-05-15 NOTE — Progress Notes (Signed)
FOLLOW UP Date of Service/Encounter:  05/16/21   Subjective:  Bob [name] (DOB: 1986-02-02) is a 35 y.o. male who [facility] in re-evaluation of the following: asthma and allergic rhinitis History obtained from: chart review and patient.  For Review, LV was on 10/05/20  with Bob Leyland, FNP seen via TeleVisit for asthma (controlled on Breo 200, 1 puff daily) and allergic rhinitis (carbinoxamine 4 mg q6-8 hrs PRN, flonase PRN).    Today he presents for follow-up. Using Breo 200, 1 puff prior to bed.  Uses rescue inhaler during illnesses-November.  Has not needed systemic steroids this year. Allergic rhinitis-Claritin, Xyzal and carbinoxamine have not worked.  Runny nose is his most bothersome symptoms.  He is opposed to nasal sprays.  Opposed to allergy shots. He has 8 dogs, and is allergic to dogs. However, [place]. His asthma has been so well controlled.  He would like another medication to help control his allergic rhinitis. Rhinitis is usually upon early morning awakening.  However, he denies any heartburn or reflux symptoms.   Allergies as of 05/16/2021   No Known Allergies      Medication List        Accurate as of May 16, 2021  4:50 PM. If you have any questions, ask your nurse or doctor.          STOP taking these medications    Carbinoxamine Maleate 4 MG Tabs Stopped by: Tonny Bollman, MD       TAKE these medications    albuterol 108 (90 Base) MCG/ACT inhaler Commonly known as: VENTOLIN HFA Inhale 2 puffs into the lungs every 4 (four) hours as needed for wheezing or shortness of breath.   allopurinol 300 MG tablet Commonly known as: ZYLOPRIM START 1 TABLET DAILY FOR 1 WEEK AFTER YOU HAVE BEEN ON ANTI-INFLAMMATORIES REGULARLY FOR A FULL WEEK. AFTER THE FIRST WEEK GO TO 2 TABLETS DAILY FOR A WEEK AND THEN YOU CAN GO TO 3 TABLETS DAILY.   colchicine 0.6 MG tablet Take by  mouth.   fluticasone furoate-vilanterol 200-25 MCG/ACT Aepb Commonly known as: Breo Ellipta INHALE 1 PUFF INTO THE LUNGS DAILY. RINSE, GARGLE AND SPIT OUT AFTER USE.   ipratropium 0.03 % nasal spray Commonly known as: ATROVENT Place 2 sprays into both nostrils 3 (three) times daily. Started by: Tonny Bollman, MD   meloxicam 7.5 MG tablet Commonly known as: MOBIC TAKE 1 TABLET BY MOUTH EVERY DAY   montelukast 10 MG tablet Commonly known as: Singulair Take 1 tablet (10 mg total) by mouth at bedtime. Started by: Tonny Bollman, MD       Past Medical History:  Diagnosis Date   Asthma    Pneumonia    Past Surgical History:  Procedure Laterality Date   KNEE SURGERY     Left wrist surgery Left 05/14/2014   OPEN REDUCTION INTERNAL FIXATION (ORIF) DISTAL RADIAL FRACTURE Left 12/09/2018   Procedure: OPEN REDUCTION INTERNAL FIXATION (ORIF) DISTAL RADIAL FRACTURE;  Surgeon: Eldred Manges, MD;  Location: [facility] OR;  Service: Orthopedics;  Laterality: Left;   Otherwise, there have been no changes to his past medical history, surgical history, family history, or social history.  ROS: All others negative except as noted per HPI.   Objective:  BP 132/80    Pulse 89    Temp 98.7 F (37.1 C) (Temporal)    Resp 18    Ht 5'  11" (1.803 m)    Wt 288 lb 12.8 oz (131 kg)    SpO2 100%    BMI 40.28 kg/m  Body mass index is 40.28 kg/m. Physical Exam: General Appearance:  Alert, cooperative, no distress, appears stated age  Head:  Normocephalic, without obvious abnormality, atraumatic  Eyes:  Conjunctiva clear, EOM's intact  Nose: Nares normal, pale boggy edematous bilateral turbinates  Throat: Lips, tongue normal; teeth and gums normal, normal posterior oropharynx  Neck: Supple, symmetrical  Lungs:   Clear to auscultation bilaterally respirations unlabored, no coughing  Heart:  Regular rate and rhythm, no murmur, appears well perfused  Extremities: No edema  Skin: Skin color, texture, turgor normal,  no rashes or lesions on visualized portions of skin  Neurologic: No gross deficits   Spirometry:  Tracings reviewed. His effort: Good reproducible efforts. FVC: 4.81L FEV1: 3.88L, 86% predicted FEV1/FVC ratio: 99% Interpretation: Spirometry consistent with normal pattern.  Please see scanned spirometry results for details.  Assessment/Plan   Patient Instructions  Moderate persistent asthma-controlled Your breathing test today looked great! Continue Breo 200 mcg- 1 puff once a day to prevent cough and wheeze Continue albuterol 2 puffs every 4 hours as needed for cough, wheeze, tightness in chest, or shortness of breath. You my use albuterol 2 puffs 5-15 minutes before activity to decrease cough or wheeze Asthma control goals:  Full participation in all desired activities (may need albuterol before activity) Albuterol use two time or less a week on average (not counting use with activity) Cough interfering with sleep two time or less a month Oral steroids no more than once a year No hospitalizations  Allergic rhinitis-uncontrolled Start Atrovent nasal spray 1-2 sprays up to three times daily. If too dry, use less. Start Singulair (montelukast) 10 mg nightly-if nightmares, behavior changes (depression, etc), discontinue immediately. Should improve if from medicine. Start an over the counter antihistamine-Your options include: Zyrtec (cetirizine) 10 mg, Claritin (loratadine) 10 mg, Xyzal (levocetirizine) 5 mg or Allegra (fexofenadine) 180 mg daily as needed-find one that doesn't make you drowsy and can take one in morning and at night.  Call the clinic if this treatment plan is not working well for you  Follow up in the clinic in 3 months or sooner if needed. If no improvement, will retest and consider allergy shots  Tonny Bollman, MD  [facility]

## 2021-05-16 ENCOUNTER — Encounter: Payer: Self-pay | Admitting: Internal Medicine

## 2021-05-16 ENCOUNTER — Other Ambulatory Visit: Payer: Self-pay | Admitting: Internal Medicine

## 2021-05-16 ENCOUNTER — Other Ambulatory Visit: Payer: Self-pay

## 2021-05-16 ENCOUNTER — Ambulatory Visit: Payer: 59 | Admitting: Internal Medicine

## 2021-05-16 VITALS — BP 132/80 | HR 89 | Temp 98.7°F | Resp 18 | Ht 71.0 in | Wt 288.8 lb

## 2021-05-16 DIAGNOSIS — J4540 Moderate persistent asthma, uncomplicated: Secondary | ICD-10-CM | POA: Diagnosis not present

## 2021-05-16 DIAGNOSIS — J3089 Other allergic rhinitis: Secondary | ICD-10-CM

## 2021-05-16 MED ORDER — ALBUTEROL SULFATE HFA 108 (90 BASE) MCG/ACT IN AERS: 2.0000 | INHALATION_SPRAY | RESPIRATORY_TRACT | 1 refills | Status: DC | PRN

## 2021-05-16 MED ORDER — FLUTICASONE FUROATE-VILANTEROL 200-25 MCG/ACT IN AEPB: INHALATION_SPRAY | RESPIRATORY_TRACT | 5 refills | Status: DC

## 2021-05-16 MED ORDER — IPRATROPIUM BROMIDE 0.03 % NA SOLN: 2.0000 | Freq: Three times a day (TID) | NASAL | 12 refills | Status: DC

## 2021-05-16 MED ORDER — MONTELUKAST SODIUM 10 MG PO TABS
10.0000 mg | ORAL_TABLET | Freq: Every day | ORAL | 5 refills | Status: DC
Start: 2021-05-16 — End: 2021-08-29

## 2021-05-16 NOTE — Patient Instructions (Addendum)
Asthma Your breathing test today looked great! Continue Breo 200 mcg- 1 puff once a day to prevent cough and wheeze Continue albuterol 2 puffs every 4 hours as needed for cough, wheeze, tightness in chest, or shortness of breath. You my use albuterol 2 puffs 5-15 minutes before activity to decrease cough or wheeze Asthma control goals:  Full participation in all desired activities (may need albuterol before activity) Albuterol use two time or less a week on average (not counting use with activity) Cough interfering with sleep two time or less a month Oral steroids no more than once a year No hospitalizations  Allergic rhinitis Start Atrovent nasal spray 1-2 sprays up to three times daily. If too dry, use less. Start Singulair (montelukast) 10 mg nightly-if nightmares, behavior changes (depression, etc), discontinue immediately. Should improve if from medicine. Start an over the counter antihistamine-Your options include: Zyrtec (cetirizine) 10 mg, Claritin (loratadine) 10 mg, Xyzal (levocetirizine) 5 mg or Allegra (fexofenadine) 180 mg daily as needed-find one that doesn't make you drowsy and can take one in morning and at night.  Call the clinic if this treatment plan is not working well for you  Follow up in the clinic in 3 months or sooner if needed. If no improvement, will retest and consider allergy shots

## 2021-07-27 ENCOUNTER — Other Ambulatory Visit: Payer: Self-pay | Admitting: Family Medicine

## 2021-08-02 ENCOUNTER — Other Ambulatory Visit: Payer: Self-pay | Admitting: Orthopaedic Surgery

## 2021-08-15 ENCOUNTER — Ambulatory Visit: Payer: 59 | Admitting: Internal Medicine

## 2021-08-21 NOTE — Progress Notes (Deleted)
? ?FOLLOW UP ?Date of Service/Encounter:  08/21/21 ? ? ?Subjective:  ?Bob [name] (DOB: 12/12/1985) is a 36 y.o. male who [facility] in re-evaluation of the following: asthma and allergic rhinitis ?History obtained from: chart review and {Persons; PED relatives w/patient:19415::"patient"}. ? ?For Review, LV was on 05/16/21  with Dr.Junaid Wurzer. ?Rhinitis was not controlled.  Dog is a known allergen and he has 8 dogs.  [place] ?Failed Claritin, Xyzal and carbinoxamine. ?Asthma controlled with Breo in [place] thearpy.  FEV1 86% at last visit ? ?At that visit, we discussed starting atrovent, singulair and an antihistamine that would not make him drowsy.  Discussed allergy injections. ? ?Today presents for follow-up. ? ?Allergies as of 08/22/2021   ?No Known Allergies ?  ? ?  ?Medication List  ?  ? ?  ? Accurate as of August 21, 2021  1:02 PM. If you have any questions, ask your nurse or doctor.  ?  ?  ? ?  ? ?albuterol 108 (90 Base) MCG/ACT inhaler ?Commonly known as: VENTOLIN HFA ?INHALE 2 PUFFS INTO THE LUNGS EVERY 4 HOURS AS NEEDED FOR WHEEZING OR SHORTNESS OF BREATH. ?  ?allopurinol 300 MG tablet ?Commonly known as: ZYLOPRIM ?START 1 TABLET DAILY FOR 1 WEEK AFTER YOU HAVE BEEN ON ANTI-INFLAMMATORIES REGULARLY FOR A FULL WEEK. AFTER THE FIRST WEEK GO TO 2 TABLETS DAILY FOR A WEEK AND THEN YOU CAN GO TO 3 TABLETS DAILY. ?  ?colchicine 0.6 MG tablet ?Take by mouth. ?  ?fluticasone furoate-vilanterol 200-25 MCG/ACT Aepb ?Commonly known as: Breo Ellipta ?INHALE 1 PUFF INTO THE LUNGS DAILY. RINSE, GARGLE AND SPIT OUT AFTER USE. ?  ?ipratropium 0.03 % nasal spray ?Commonly known as: ATROVENT ?Place 2 sprays into both nostrils 3 (three) times daily. ?  ?meloxicam 7.5 MG tablet ?Commonly known as: MOBIC ?TAKE 1 TABLET BY MOUTH EVERY DAY ?  ?montelukast 10 MG tablet ?Commonly known as: Singulair ?Take 1 tablet (10 mg total) by mouth at bedtime. ?  ? ?  ? ?Past Medical  History:  ?Diagnosis Date  ? Asthma   ? Pneumonia   ? ?Past Surgical History:  ?Procedure Laterality Date  ? KNEE SURGERY    ? Left wrist surgery Left 05/14/2014  ? OPEN REDUCTION INTERNAL FIXATION (ORIF) DISTAL RADIAL FRACTURE Left 12/09/2018  ? Procedure: OPEN REDUCTION INTERNAL FIXATION (ORIF) DISTAL RADIAL FRACTURE;  Surgeon: Eldred Manges, MD;  Location: [facility] OR;  Service: Orthopedics;  Laterality: Left;  ? ?Otherwise, there have been no changes to his past medical history, surgical history, family history, or social history. ? ?ROS: All others negative except as noted per HPI.  ? ?Objective:  ?There were no vitals taken for this visit. ?There is no height or weight on file to calculate BMI. ?Physical Exam: ?General Appearance:  Alert, cooperative, no distress, appears stated age  ?Head:  Normocephalic, without obvious abnormality, atraumatic  ?Eyes:  Conjunctiva clear, EOM's intact  ?Nose: Nares normal, {Blank multiple:19196:a:"***","hypertrophic turbinates","normal mucosa","no visible anterior polyps","septum midline"}  ?Throat: Lips, tongue normal; teeth and gums normal, {Blank multiple:19196:a:"***","normal posterior oropharynx","tonsils 2+","tonsils 3+","no tonsillar exudate","+ cobblestoning"}  ?Neck: Supple, symmetrical  ?Lungs:   {Blank multiple:19196:a:"***","clear to auscultation bilaterally","end-expiratory wheezing","wheezing throughout"}, Respirations unlabored, {Blank multiple:19196:a:"***","no coughing","intermittent dry coughing"}  ?Heart:  {Blank multiple:19196:a:"***","regular rate and rhythm","no murmur"}, Appears well perfused  ?Extremities: No edema  ?Skin: Skin color, texture, turgor normal, no rashes or lesions on visualized portions of skin  ?Neurologic: No gross deficits  ? ?  Reviewed: *** ? ?Spirometry:  ?Tracings reviewed. His effort: {Blank single:19197::"Good reproducible efforts.","It was hard to get consistent efforts and there is a question as to whether this reflects a maximal  maneuver.","Poor effort, data can not be interpreted.","Variable effort-results affected.","decent for first attempt at spirometry."} ?FVC: ***L ?FEV1: ***L, ***% predicted ?FEV1/FVC ratio: ***% ?Interpretation: {Blank single:19197::"Spirometry consistent with mild obstructive disease","Spirometry consistent with moderate obstructive disease","Spirometry consistent with severe obstructive disease","Spirometry consistent with possible restrictive disease","Spirometry consistent with mixed obstructive and restrictive disease","Spirometry uninterpretable due to technique","Spirometry consistent with normal pattern","No overt abnormalities noted given today's efforts"}.  ?Please see scanned spirometry results for details. ? ?Skin Testing: {Blank single:19197::"Select foods","Environmental allergy panel","Environmental allergy panel and select foods","Food allergy panel","None","Deferred due to recent antihistamines use"}. ?Positive test to: ***. Negative test to: ***.  ?Results discussed with patient/family. ? ? ?{Blank single:19197::"Allergy testing results were read and interpreted by myself, documented by clinical staff."," "} ? ?Assessment/Plan  ? ?*** ? ?Tonny Bollman, MD  ?[facility] ? ? ? ? ? ? ?

## 2021-08-22 ENCOUNTER — Ambulatory Visit: Payer: 59 | Admitting: Internal Medicine

## 2021-08-28 NOTE — Progress Notes (Signed)
? ?FOLLOW UP ?Date of Service/Encounter:  08/29/21 ? ? ?Subjective:  ?Bob [name] (DOB: Jun 06, 1985) is a 36 y.o. male who [facility] in re-evaluation of the following: Asthma, seasonal and perennial allergic rhinitis ?History obtained from: chart review and patient. ? ?For Review, LV was on 05/16/21  with Dr.Ashur Glatfelter.  At his last visit, he discussed uncontrolled rhinorrhea. Spiro at LV, 86% FEV1, Started atrovent nasal spray and singulair. ? ?Pertinent history/diagnostics:  ?Failed Claritin, Xyzal and carbinoxamine.  Opposed to nasal sprays and allergy injections.  Owns 8 dogs and is allergic to them. ?Asthma-diagnosed around age 31 or 36 yo, controlled on Breo 200. Has been on Advair in the past.  ? ?Today presents for follow-up. ?Reports that he is doing well on his current plan.  His asthma has been under control and he has not needed his rescue inhaler at all since last visit.  No antibiotics.  He is currently on prednisone due to joint issues.  Has not required any steroids for breathing issues. ?His allergic rhinitis is also better control, but he suspects this is due to being in [place] for the past several weeks where his allergies do not affect him. ?He did use the Atrovent given to him at last visit when he had the flu, and states that it did help.  Singulair has been most effective for him.  He is no longer taking an antihistamine. ? ? ?Allergies as of 08/29/2021   ?No Known Allergies ?  ? ?  ?Medication List  ?  ? ?  ? Accurate as of August 29, 2021  1:31 PM. If you have any questions, ask your nurse or doctor.  ?  ?  ? ?  ? ?STOP taking these medications   ? ?fluticasone furoate-vilanterol 200-25 MCG/ACT Aepb ?Commonly known as: Breo Ellipta ?Replaced by: Earlie Server 200-25 MCG/ACT Aepb ?Stopped by: Tonny Bollman, MD ?  ? ?  ? ?TAKE these medications   ? ?albuterol 108 (90 Base) MCG/ACT inhaler ?Commonly known as: VENTOLIN HFA ?INHALE 2 PUFFS INTO THE LUNGS  EVERY 4 HOURS AS NEEDED FOR WHEEZING OR SHORTNESS OF BREATH. ?  ?allopurinol 300 MG tablet ?Commonly known as: ZYLOPRIM ?START 1 TABLET DAILY FOR 1 WEEK AFTER YOU HAVE BEEN ON ANTI-INFLAMMATORIES REGULARLY FOR A FULL WEEK. AFTER THE FIRST WEEK GO TO 2 TABLETS DAILY FOR A WEEK AND THEN YOU CAN GO TO 3 TABLETS DAILY. ?  ?Breo Ellipta 200-25 MCG/ACT Aepb ?Generic drug: fluticasone furoate-vilanterol ?INHALE 1 PUFF INTO THE LUNGS DAILY. RINSE, GARGLE AND SPIT OUT AFTER USE. ?Replaces: fluticasone furoate-vilanterol 200-25 MCG/ACT Aepb ?Started by: Tonny Bollman, MD ?  ?colchicine 0.6 MG tablet ?Take by mouth. ?  ?ipratropium 0.03 % nasal spray ?Commonly known as: ATROVENT ?Place 2 sprays into both nostrils 3 (three) times daily. ?  ?meloxicam 7.5 MG tablet ?Commonly known as: MOBIC ?TAKE 1 TABLET BY MOUTH EVERY DAY ?  ?montelukast 10 MG tablet ?Commonly known as: Singulair ?Take 1 tablet (10 mg total) by mouth at bedtime. ?  ? ?  ? ?Past Medical History:  ?Diagnosis Date  ? Asthma   ? Pneumonia   ? ?Past Surgical History:  ?Procedure Laterality Date  ? KNEE SURGERY    ? Left wrist surgery Left 05/14/2014  ? OPEN REDUCTION INTERNAL FIXATION (ORIF) DISTAL RADIAL FRACTURE Left 12/09/2018  ? Procedure: OPEN REDUCTION INTERNAL FIXATION (ORIF) DISTAL RADIAL FRACTURE;  Surgeon: Eldred Manges, MD;  Location: [facility] OR;  Service: Orthopedics;  Laterality: Left;  ? ?Otherwise, there have been no changes to his past medical history, surgical history, family history, or social history. ? ?ROS: All others negative except as noted per HPI.  ? ?Objective:  ?BP (!) 160/90   Pulse (!) 102   Temp 98.9 ?F (37.2 ?C) (Temporal)   Resp 20   SpO2 96%  ?There is no height or weight on file to calculate BMI. ?Physical Exam: ?General Appearance:  Alert, cooperative, no distress, appears stated age  ?Head:  Normocephalic, without obvious abnormality, atraumatic  ?Eyes:  Conjunctiva clear, EOM's intact  ?Nose: Nares normal, hypertrophic turbinates,  normal mucosa, no visible anterior polyps, and septum midline  ?Throat: Lips, tongue normal; teeth and gums normal, normal posterior oropharynx  ?Neck: Supple, symmetrical  ?Lungs:   clear to auscultation bilaterally, Respirations unlabored, no coughing  ?Heart:  regular rate and rhythm and no murmur, Appears well perfused  ?Extremities: No edema  ?Skin: Skin color, texture, turgor normal, no rashes or lesions on visualized portions of skin  ?Neurologic: No gross deficits  ? ?Spirometry:  ?Tracings reviewed. His effort: Good reproducible efforts. ?FVC: 4.74L ?FEV1: 3.87L, 86% predicted ?FEV1/FVC ratio: 100% ?Interpretation: No overt abnormalities noted given today's efforts.  ?Please see scanned spirometry results for details. ? ?Assessment/Plan  ?Allergies and asthma controlled at this time.  We will continue on current therapeutic regimen. ? ?Asthma-controlled ?Your breathing test today looked great! ?Continue Breo 200 mcg- 1 puff once a day to prevent cough and wheeze ?Continue albuterol 2 puffs every 4 hours as needed for cough, wheeze, tightness in chest, or shortness of breath. ?You my use albuterol 2 puffs 5-15 minutes before activity to decrease cough or wheeze ?Asthma control goals:  ?Full participation in all desired activities (may need albuterol before activity) ?Albuterol use two time or less a week on average (not counting use with activity) ?Cough interfering with sleep two time or less a month ?Oral steroids no more than once a year ?No hospitalizations ? ?Allergic rhinitis-stable ?Continue Atrovent nasal spray 1-2 sprays up to three times daily. If too dry, use less. ?Continue Singulair (montelukast) 10 mg nightly-if nightmares, behavior changes (depression, etc), discontinue immediately. Should improve if from medicine. ?Continue an over the counter antihistamine-Your options include: Zyrtec (cetirizine) 10 mg, Claritin (loratadine) 10 mg, Xyzal (levocetirizine) 5 mg or Allegra (fexofenadine) 180  mg daily as needed-find one that doesn't make you drowsy and can take one in morning and at night. ? ?Call the clinic if this treatment plan is not working well for you ? ?Follow up in the clinic in 6 months or sooner if needed. ?If no improvement, will retest and consider allergy shots ? ?Sigurd Sos, MD  ?[facility] ? ? ? ? ? ? ?

## 2021-08-29 ENCOUNTER — Ambulatory Visit: Payer: 59 | Admitting: Internal Medicine

## 2021-08-29 ENCOUNTER — Encounter: Payer: Self-pay | Admitting: Internal Medicine

## 2021-08-29 ENCOUNTER — Other Ambulatory Visit: Payer: Self-pay

## 2021-08-29 VITALS — BP 160/90 | HR 102 | Temp 98.9°F | Resp 20

## 2021-08-29 DIAGNOSIS — J3089 Other allergic rhinitis: Secondary | ICD-10-CM

## 2021-08-29 DIAGNOSIS — J4540 Moderate persistent asthma, uncomplicated: Secondary | ICD-10-CM

## 2021-08-29 MED ORDER — MONTELUKAST SODIUM 10 MG PO TABS: 10.0000 mg | ORAL_TABLET | Freq: Every day | ORAL | 5 refills | Status: AC

## 2021-08-29 MED ORDER — BREO ELLIPTA 200-25 MCG/ACT IN AEPB
INHALATION_SPRAY | RESPIRATORY_TRACT | 5 refills | Status: DC
Start: 2021-08-29 — End: 2022-02-14

## 2021-08-29 NOTE — Patient Instructions (Signed)
Asthma Your breathing test today looked great! Continue Breo 200 mcg- 1 puff once a day to prevent cough and wheeze Continue albuterol 2 puffs every 4 hours as needed for cough, wheeze, tightness in chest, or shortness of breath. You my use albuterol 2 puffs 5-15 minutes before activity to decrease cough or wheeze Asthma control goals:  Full participation in all desired activities (may need albuterol before activity) Albuterol use two time or less a week on average (not counting use with activity) Cough interfering with sleep two time or less a month Oral steroids no more than once a year No hospitalizations  Allergic rhinitis Continue Atrovent nasal spray 1-2 sprays up to three times daily. If too dry, use less. Continue Singulair (montelukast) 10 mg nightly-if nightmares, behavior changes (depression, etc), discontinue immediately. Should improve if from medicine. Continue an over the counter antihistamine-Your options include: Zyrtec (cetirizine) 10 mg, Claritin (loratadine) 10 mg, Xyzal (levocetirizine) 5 mg or Allegra (fexofenadine) 180 mg daily as needed-find one that doesn't make you drowsy and can take one in morning and at night.  Call the clinic if this treatment plan is not working well for you  Follow up in the clinic in 6 months or sooner if needed. If no improvement, will retest and consider allergy shots   

## 2021-09-15 ENCOUNTER — Other Ambulatory Visit: Payer: Self-pay | Admitting: Internal Medicine

## 2021-11-14 ENCOUNTER — Other Ambulatory Visit: Payer: Self-pay | Admitting: Internal Medicine

## 2021-12-15 ENCOUNTER — Other Ambulatory Visit: Payer: Self-pay | Admitting: Orthopaedic Surgery

## 2022-01-07 ENCOUNTER — Other Ambulatory Visit: Payer: Self-pay | Admitting: Internal Medicine

## 2022-02-14 ENCOUNTER — Other Ambulatory Visit: Payer: Self-pay | Admitting: Internal Medicine

## 2022-03-05 NOTE — Progress Notes (Unsigned)
FOLLOW UP Date of Service/Encounter:  03/06/22   Subjective:  Bob [name] (DOB: 1986/03/03) is a 36 y.o. male who [facility] to [facility] on 03/06/2022 in re-evaluation of the following: Asthma, seasonal and perennial allergic rhinitis History obtained from: chart review and patient.  For Review, LV was on 08/29/21  with Dr.Vonnie Ligman seen for routine follow-up.  His asthma is reported as well controlled at that visit.  FEV1 86%. ------- Pertinent history/diagnostics:  Failed Claritin, Xyzal and carbinoxamine.  Opposed to nasal sprays and allergy injections, but did try Atrovent and reported effective.  Singulair controls symptoms.  Not on any antihistamines.  Owns 8 dogs and is allergic to them. Asthma-diagnosed around age 62 or 36 yo, controlled on Breo 200. Has been on Advair in the past.  -------- Today presents for follow-up. Asthma:  He hears crackle at night and is coughing up a lot of clear mucus  in the mornings.   He has been getting monthly labs since starting ozempic and his blood work has been overall reassuring.  He feels great during the day, but symptoms arise at night. He has tried his albuterol for the symptoms and does not feel it is helpful.  He does continue on his Breo 1 puff [name]. He is eating really well and has lost 40 pounds since last visit. Does not feel he has reflux. He has never been on reflux medication. He denies having any other sick symptoms. He is having drainage for which Atrovent does help.  Allergic rhinitis: Moderately controlled.  Will use Atrovent nasal spray occasionally which does help, but he does not like taking any medications on a regular basis.  He is taking Singulair [name] and is not sure that it is helpful at all.   Allergies as of 03/06/2022   No Known Allergies      Medication List        Accurate as of March 06, 2022 12:17 PM. If you have any questions, ask your nurse or doctor.          albuterol 108  (90 Base) MCG/ACT inhaler Commonly known as: VENTOLIN HFA INHALE 2 PUFFS BY MOUTH EVERY 4 HOURS AS NEEDED FOR WHEEZE OR FOR SHORTNESS OF BREATH   allopurinol 300 MG tablet Commonly known as: ZYLOPRIM START 1 TABLET [name] FOR 1 WEEK AFTER YOU HAVE BEEN ON ANTI-INFLAMMATORIES REGULARLY FOR A FULL WEEK. AFTER THE FIRST WEEK GO TO 2 TABLETS [name] FOR A WEEK AND THEN YOU CAN GO TO 3 TABLETS [name].   Breo Ellipta 200-25 MCG/ACT Aepb Generic drug: fluticasone furoate-vilanterol INHALE 1 PUFF INTO THE LUNGS [name]. RINSE, GARGLE AND SPIT OUT AFTER USE.   colchicine 0.6 MG tablet Take by mouth.   ipratropium 0.03 % nasal spray Commonly known as: ATROVENT Place 2 sprays into both nostrils 3 (three) times [name].   meloxicam 7.5 MG tablet Commonly known as: MOBIC TAKE 1 TABLET BY MOUTH EVERY DAY   montelukast 10 MG tablet Commonly known as: Singulair Take 1 tablet (10 mg total) by mouth at bedtime.   OZEMPIC (1 MG/DOSE) [place] Inject into the skin. weekly       Past Medical History:  Diagnosis Date   Asthma    Pneumonia    Past Surgical History:  Procedure Laterality Date   KNEE SURGERY     Left wrist surgery Left 05/14/2014   OPEN REDUCTION INTERNAL FIXATION (ORIF) DISTAL RADIAL FRACTURE Left 12/09/2018   Procedure: OPEN REDUCTION INTERNAL FIXATION (ORIF)  DISTAL RADIAL FRACTURE;  Surgeon: Eldred Manges, MD;  Location: [facility];  Service: Orthopedics;  Laterality: Left;   Otherwise, there have been no changes to his past medical history, surgical history, family history, or social history.  ROS: All others negative except as noted per HPI.   Objective:  BP 132/70   Pulse 95   Temp 98.8 F (37.1 C) (Temporal)   Resp 16   Wt 252 lb 3.2 oz (114.4 kg)   SpO2 98%   BMI 35.17 kg/m  Body mass index is 35.17 kg/m. Physical Exam: General Appearance:  Alert, cooperative, no distress, appears stated age  Head:  Normocephalic, without obvious abnormality, atraumatic  Eyes:  Conjunctiva  clear, EOM's intact  Nose: Nares normal, hypertrophic turbinates, normal mucosa, no visible anterior polyps, and septum midline  Throat: Lips, tongue normal; teeth and gums normal, normal posterior oropharynx  Neck: Supple, symmetrical  Lungs:   clear to auscultation bilaterally, Respirations unlabored, no coughing  Heart:  regular rate and rhythm and no murmur, Appears well perfused  Extremities: No edema  Skin: Skin color, texture, turgor normal, no rashes or lesions on visualized portions of skin  Neurologic: No gross deficits   Spirometry:  Tracings reviewed. His effort: Good reproducible efforts. FVC: 5.36L FEV1: 4.48L, 101% predicted FEV1/FVC ratio: 104% Interpretation: Spirometry consistent with normal pattern.  Please see scanned spirometry results for details.  Assessment/Plan  Lungs today sound great with no wheezing or crackles, his spirometry looks great.  Symptoms not responsive to albuterol so do not suspect this is secondary to uncontrolled asthma. His allergies are only moderately controlled, but he is not interested in allergy injections at this time due to time constraints although this is the only preventative measure available. We discussed protracted bronchitis for which she was prescribed a Z-Pak.  Offered chest x-ray but he declined at this time.  Reasonable given normal lung exams and otherwise absence of sick symptoms. We also discussed possible increased drainage due to uncontrolled allergies versus possible silent reflux.  Trial of Pepcid [name].  He will continue Atrovent as needed to help with drainage.  He will also follow-up with his primary care doctor.  Asthma-controlled Your breathing test today looked great! Continue Breo 200 mcg- 1 puff once a day to prevent cough and wheeze Continue albuterol 2 puffs every 4 hours as needed for cough, wheeze, tightness in chest, or shortness of breath. You my use albuterol 2 puffs 5-15 minutes before activity to  decrease cough or wheeze Asthma control goals:  Full participation in all desired activities (may need albuterol before activity) Albuterol use two time or less a week on average (not counting use with activity) Cough interfering with sleep two time or less a month Oral steroids no more than once a year No hospitalizations Continue with weight loss goals  Allergic rhinitis-partially controlled Continue Atrovent nasal spray 1-2 sprays up to three times [name]. If too dry, use less. Stop Singulair (montelukast) as has not been effective Over the counter antihistamine as needed-Your options include: Zyrtec (cetirizine) 10 mg, Claritin (loratadine) 10 mg, Xyzal (levocetirizine) 5 mg or Allegra (fexofenadine) 180 mg [name] as needed-find one that doesn't make you drowsy and can take one in morning and at night.  Morning mucus production and abnormal breath sounds (crackles) - normal lung exam and spirometry - start z-pack - trial of famotidine 40 mg [name] - continue atrovent nasal spray 1-2 sprays nightly as needed for drainage - patient to call in 1 month  with update on symptoms  Call the clinic if this treatment plan is not working well for you  Follow up in the clinic in 6 months or sooner if needed. If no improvement, will retest and consider allergy shots  Tonny Bollman, MD  [facility]

## 2022-03-06 ENCOUNTER — Ambulatory Visit: Payer: 59 | Admitting: Internal Medicine

## 2022-03-06 ENCOUNTER — Encounter: Payer: Self-pay | Admitting: Internal Medicine

## 2022-03-06 VITALS — BP 132/70 | HR 95 | Temp 98.8°F | Resp 16 | Wt 252.2 lb

## 2022-03-06 DIAGNOSIS — J4540 Moderate persistent asthma, uncomplicated: Secondary | ICD-10-CM | POA: Diagnosis not present

## 2022-03-06 DIAGNOSIS — J3089 Other allergic rhinitis: Secondary | ICD-10-CM

## 2022-03-06 DIAGNOSIS — R0982 Postnasal drip: Secondary | ICD-10-CM | POA: Diagnosis not present

## 2022-03-06 MED ORDER — AZITHROMYCIN 250 MG PO TABS: ORAL_TABLET | ORAL | 0 refills | Status: DC

## 2022-03-06 NOTE — Patient Instructions (Addendum)
Asthma Your breathing test today looked great! Continue Breo 200 mcg- 1 puff once a day to prevent cough and wheeze Continue albuterol 2 puffs every 4 hours as needed for cough, wheeze, tightness in chest, or shortness of breath. You my use albuterol 2 puffs 5-15 minutes before activity to decrease cough or wheeze Asthma control goals:  Full participation in all desired activities (may need albuterol before activity) Albuterol use two time or less a week on average (not counting use with activity) Cough interfering with sleep two time or less a month Oral steroids no more than once a year No hospitalizations  Allergic rhinitis Continue Atrovent nasal spray 1-2 sprays up to three times daily. If too dry, use less. Continue Singulair (montelukast) 10 mg nightly-if nightmares, behavior changes (depression, etc), discontinue immediately. Should improve if from medicine. Continue an over the counter antihistamine-Your options include: Zyrtec (cetirizine) 10 mg, Claritin (loratadine) 10 mg, Xyzal (levocetirizine) 5 mg or Allegra (fexofenadine) 180 mg daily as needed-find one that doesn't make you drowsy and can take one in morning and at night.  Call the clinic if this treatment plan is not working well for you  Follow up in the clinic in 6 months or sooner if needed. If no improvement, will retest and consider allergy shots

## 2022-03-08 ENCOUNTER — Other Ambulatory Visit: Payer: Self-pay | Admitting: Internal Medicine

## 2022-04-17 ENCOUNTER — Other Ambulatory Visit: Payer: Self-pay | Admitting: Internal Medicine

## 2022-05-08 ENCOUNTER — Other Ambulatory Visit: Payer: Self-pay | Admitting: Orthopaedic Surgery

## 2022-06-07 ENCOUNTER — Other Ambulatory Visit: Payer: Self-pay | Admitting: Internal Medicine

## 2022-06-22 ENCOUNTER — Other Ambulatory Visit: Payer: Self-pay | Admitting: Internal Medicine

## 2022-09-04 ENCOUNTER — Ambulatory Visit: Payer: 59 | Admitting: Internal Medicine

## 2022-09-30 ENCOUNTER — Other Ambulatory Visit: Payer: Self-pay | Admitting: Orthopaedic Surgery

## 2022-10-18 ENCOUNTER — Other Ambulatory Visit: Payer: Self-pay | Admitting: Internal Medicine

## 2022-12-23 ENCOUNTER — Other Ambulatory Visit: Payer: Self-pay | Admitting: Internal Medicine

## 2023-01-08 ENCOUNTER — Other Ambulatory Visit (HOSPITAL_BASED_OUTPATIENT_CLINIC_OR_DEPARTMENT_OTHER): Payer: Self-pay

## 2023-01-08 MED ORDER — ZEPBOUND 7.5 MG/0.5ML ~~LOC~~ SOAJ
7.5000 mg | SUBCUTANEOUS | 3 refills | Status: AC
  Filled 2023-01-08: qty 2, 28d supply, fill #0
  Filled 2023-02-19 – 2023-02-21 (×2): qty 2, 28d supply, fill #1
  Filled 2023-03-18: qty 2, 28d supply, fill #2
  Filled 2023-04-13: qty 2, 28d supply, fill #3

## 2023-02-19 ENCOUNTER — Other Ambulatory Visit (HOSPITAL_BASED_OUTPATIENT_CLINIC_OR_DEPARTMENT_OTHER): Payer: Self-pay

## 2023-02-21 ENCOUNTER — Other Ambulatory Visit (HOSPITAL_BASED_OUTPATIENT_CLINIC_OR_DEPARTMENT_OTHER): Payer: Self-pay

## 2023-03-04 ENCOUNTER — Other Ambulatory Visit (HOSPITAL_BASED_OUTPATIENT_CLINIC_OR_DEPARTMENT_OTHER): Payer: Self-pay

## 2023-03-04 MED ORDER — ZEPBOUND 7.5 MG/0.5ML ~~LOC~~ SOAJ
7.5000 mg | SUBCUTANEOUS | 3 refills | Status: AC
  Filled 2023-03-19 – 2023-05-11 (×2): qty 2, 28d supply, fill #0
  Filled 2023-06-10: qty 2, 28d supply, fill #1
  Filled 2023-07-04: qty 2, 28d supply, fill #2
  Filled 2023-07-31: qty 2, 28d supply, fill #3

## 2023-03-18 ENCOUNTER — Other Ambulatory Visit: Payer: Self-pay

## 2023-03-18 ENCOUNTER — Other Ambulatory Visit (HOSPITAL_BASED_OUTPATIENT_CLINIC_OR_DEPARTMENT_OTHER): Payer: Self-pay

## 2023-03-19 ENCOUNTER — Other Ambulatory Visit (HOSPITAL_BASED_OUTPATIENT_CLINIC_OR_DEPARTMENT_OTHER): Payer: Self-pay

## 2023-04-14 ENCOUNTER — Other Ambulatory Visit (HOSPITAL_BASED_OUTPATIENT_CLINIC_OR_DEPARTMENT_OTHER): Payer: Self-pay

## 2023-05-12 ENCOUNTER — Other Ambulatory Visit (HOSPITAL_BASED_OUTPATIENT_CLINIC_OR_DEPARTMENT_OTHER): Payer: Self-pay

## 2023-06-10 ENCOUNTER — Other Ambulatory Visit (HOSPITAL_BASED_OUTPATIENT_CLINIC_OR_DEPARTMENT_OTHER): Payer: Self-pay

## 2023-06-20 ENCOUNTER — Other Ambulatory Visit (HOSPITAL_BASED_OUTPATIENT_CLINIC_OR_DEPARTMENT_OTHER): Payer: Self-pay

## 2023-06-20 MED ORDER — ZEPBOUND 7.5 MG/0.5ML ~~LOC~~ SOAJ
7.5000 mg | SUBCUTANEOUS | 3 refills | Status: AC
  Filled 2023-06-20 – 2023-11-19 (×4): qty 2, 28d supply, fill #0
  Filled 2023-12-23: qty 2, 28d supply, fill #1
  Filled 2024-01-12 – 2024-01-15 (×2): qty 2, 28d supply, fill #2
  Filled 2024-02-10: qty 2, 28d supply, fill #3

## 2023-07-04 ENCOUNTER — Other Ambulatory Visit (HOSPITAL_BASED_OUTPATIENT_CLINIC_OR_DEPARTMENT_OTHER): Payer: Self-pay

## 2023-07-05 ENCOUNTER — Other Ambulatory Visit: Payer: Self-pay | Admitting: Internal Medicine

## 2023-07-07 NOTE — Telephone Encounter (Signed)
Called and left a message for patient to inform him that a refill has been sent and that he needed to schedule a follow up appointment.

## 2023-07-28 ENCOUNTER — Other Ambulatory Visit: Payer: Self-pay | Admitting: Internal Medicine

## 2023-07-31 ENCOUNTER — Other Ambulatory Visit (HOSPITAL_BASED_OUTPATIENT_CLINIC_OR_DEPARTMENT_OTHER): Payer: Self-pay

## 2023-08-21 ENCOUNTER — Other Ambulatory Visit (HOSPITAL_BASED_OUTPATIENT_CLINIC_OR_DEPARTMENT_OTHER): Payer: Self-pay

## 2023-08-21 MED ORDER — ZEPBOUND 10 MG/0.5ML ~~LOC~~ SOAJ
10.0000 mg | SUBCUTANEOUS | 3 refills | Status: DC
  Filled 2023-08-21: qty 2, 28d supply, fill #0
  Filled 2023-09-19: qty 2, 28d supply, fill #1
  Filled 2023-10-28: qty 2, 28d supply, fill #2
  Filled 2023-11-19 – 2023-11-23 (×2): qty 2, 28d supply, fill #3

## 2023-08-21 MED ORDER — VITAMIN D (ERGOCALCIFEROL) 1.25 MG (50000 UNIT) PO CAPS
50000.0000 [IU] | ORAL_CAPSULE | ORAL | 3 refills | Status: AC
  Filled 2023-08-21: qty 4, 28d supply, fill #0
  Filled 2023-09-19: qty 4, 28d supply, fill #1
  Filled 2023-10-28 (×2): qty 4, 28d supply, fill #2
  Filled 2023-11-23: qty 4, 28d supply, fill #3
  Filled 2023-12-23: qty 4, 28d supply, fill #4
  Filled 2024-01-26: qty 4, 28d supply, fill #5
  Filled 2024-03-15: qty 4, 28d supply, fill #6
  Filled 2024-05-08: qty 4, 28d supply, fill #7

## 2023-08-21 MED ORDER — DOXYCYCLINE HYCLATE 100 MG PO TABS
100.0000 mg | ORAL_TABLET | Freq: Two times a day (BID) | ORAL | 0 refills | Status: DC
  Filled 2023-08-21: qty 20, 10d supply, fill #0

## 2023-08-21 MED ORDER — AZITHROMYCIN 500 MG PO TABS
500.0000 mg | ORAL_TABLET | Freq: Every day | ORAL | 0 refills | Status: DC
  Filled 2023-08-21: qty 5, 5d supply, fill #0

## 2023-08-21 MED ORDER — ALLOPURINOL 300 MG PO TABS
300.0000 mg | ORAL_TABLET | Freq: Every day | ORAL | 3 refills | Status: AC
Start: 2023-08-21 — End: ?
  Filled 2023-08-21 – 2023-12-23 (×7): qty 30, 30d supply, fill #0
  Filled 2023-12-30 – 2024-01-12 (×2): qty 90, 90d supply, fill #0
  Filled 2024-01-29: qty 30, 30d supply, fill #0

## 2023-08-21 MED ORDER — COLCHICINE 0.6 MG PO TABS
0.6000 mg | ORAL_TABLET | Freq: Two times a day (BID) | ORAL | 1 refills | Status: DC | PRN
  Filled 2023-08-21 (×2): qty 60, 30d supply, fill #0
  Filled 2023-09-19: qty 60, 30d supply, fill #1

## 2023-09-19 ENCOUNTER — Other Ambulatory Visit: Payer: Self-pay

## 2023-09-19 ENCOUNTER — Other Ambulatory Visit (HOSPITAL_BASED_OUTPATIENT_CLINIC_OR_DEPARTMENT_OTHER): Payer: Self-pay

## 2023-10-28 ENCOUNTER — Telehealth: Payer: Self-pay | Admitting: Urology

## 2023-10-28 ENCOUNTER — Other Ambulatory Visit (HOSPITAL_BASED_OUTPATIENT_CLINIC_OR_DEPARTMENT_OTHER): Payer: Self-pay

## 2023-10-28 NOTE — Telephone Encounter (Signed)
 Sent patient a Clinical cytogeneticist message letting him know that we do not perform vasectomy reversals here. Refer patient to Dr Jarvis Mesa if patient calls back. PH: [phone].

## 2023-11-19 ENCOUNTER — Ambulatory Visit: Admitting: Plastic Surgery

## 2023-11-19 ENCOUNTER — Other Ambulatory Visit: Payer: Self-pay

## 2023-11-19 ENCOUNTER — Encounter: Payer: Self-pay | Admitting: Plastic Surgery

## 2023-11-19 ENCOUNTER — Other Ambulatory Visit (HOSPITAL_BASED_OUTPATIENT_CLINIC_OR_DEPARTMENT_OTHER): Payer: Self-pay

## 2023-11-19 VITALS — BP 130/83 | HR 74 | Ht 72.0 in | Wt 225.8 lb

## 2023-11-19 DIAGNOSIS — D489 Neoplasm of uncertain behavior, unspecified: Secondary | ICD-10-CM

## 2023-11-19 DIAGNOSIS — R220 Localized swelling, mass and lump, head: Secondary | ICD-10-CM | POA: Diagnosis not present

## 2023-11-19 NOTE — Progress Notes (Signed)
 Referring Provider Emaline Handsome, MD [address] [place],  [place] [address]   CC:  Chief Complaint  Patient presents with   Advice Only      Bob [name] is an 38 y.o. male.  HPI: Bob [name] is a 38 year old male who presents today for evaluation management of 2 subcutaneous masses of the scalp.  They have been there for many years though he is unsure exactly how long.  They are not painful but they seem to have grown slightly.  Like to have them removed to make sure they benign.  He has no significant medical problems does not take any blood thinners does not smoke but he does dip.  No Known Allergies  Outpatient Encounter Medications as of 11/19/2023  Medication Sig   albuterol  (VENTOLIN  HFA) 108 (90 Base) MCG/ACT inhaler INHALE 2 PUFFS BY MOUTH EVERY 4 HOURS AS NEEDED FOR WHEEZE OR FOR SHORTNESS OF BREATH   allopurinol  (ZYLOPRIM ) 300 MG tablet Take 1 tablet (300 mg total) by mouth daily.   azithromycin  (ZITHROMAX ) 250 MG tablet Take 2 tablets on day 1, then 1 tablet daily on days 2 through 5.   azithromycin  (ZITHROMAX ) 500 MG tablet Take 1 tablet (500 mg total) by mouth daily for 5 days.   BREO ELLIPTA  200-25 MCG/ACT AEPB INHALE 1 PUFF INTO THE LUNGS DAILY. RINSE, GARGLE AND SPIT OUT AFTER USE.   colchicine  0.6 MG tablet Take by mouth.   colchicine  0.6 MG tablet Take 1 tablet (0.6 mg total) by mouth 2 (two) times daily as needed for acute gout attack. Stop when better.   doxycycline  (VIBRA -TABS) 100 MG tablet Take 1 tablet (100 mg total) by mouth 2 (two) times daily for 10 days.   ipratropium (ATROVENT ) 0.03 % nasal spray PLACE 2 SPRAYS INTO BOTH NOSTRILS 3 (THREE) TIMES DAILY   meloxicam  (MOBIC ) 7.5 MG tablet TAKE 1 TABLET BY MOUTH EVERY DAY   montelukast  (SINGULAIR ) 10 MG tablet Take 1 tablet (10 mg total) by mouth at bedtime.   Semaglutide (OZEMPIC, 1 MG/DOSE, [place]) Inject into the skin. weekly   tirzepatide  (ZEPBOUND ) 10 MG/0.5ML Pen Inject 10 mg into  the skin once a week.   tirzepatide  (ZEPBOUND ) 7.5 MG/0.5ML Pen Inject 7.5 mg into the skin once a week.   tirzepatide  (ZEPBOUND ) 7.5 MG/0.5ML Pen Inject 7.5 mg into the skin once a week.   tirzepatide  (ZEPBOUND ) 7.5 MG/0.5ML Pen Inject 7.5 mg into the skin once a week.   Vitamin D , Ergocalciferol , (DRISDOL ) 1.25 MG (50000 UNIT) CAPS capsule Take 1 capsule (50,000 Units total) by mouth once a week at 9am.   allopurinol  (ZYLOPRIM ) 300 MG tablet START 1 TABLET DAILY FOR 1 WEEK AFTER YOU HAVE BEEN ON ANTI-INFLAMMATORIES REGULARLY FOR A FULL WEEK. AFTER THE FIRST WEEK GO TO 2 TABLETS DAILY FOR A WEEK AND THEN YOU CAN GO TO 3 TABLETS DAILY. (Patient not taking: Reported on 11/19/2023)   No facility-administered encounter medications on file as of 11/19/2023.     Past Medical History:  Diagnosis Date   Asthma    Pneumonia     Past Surgical History:  Procedure Laterality Date   KNEE SURGERY     Left wrist surgery Left 05/14/2014   OPEN REDUCTION INTERNAL FIXATION (ORIF) DISTAL RADIAL FRACTURE Left 12/09/2018   Procedure: OPEN REDUCTION INTERNAL FIXATION (ORIF) DISTAL RADIAL FRACTURE;  Surgeon: Adah Acron, MD;  Location: [facility] OR;  Service: Orthopedics;  Laterality: Left;    Family History  Problem Relation Age of Onset   Allergic rhinitis Sister    Asthma Sister    Angioedema Neg Hx    Eczema Neg Hx    Immunodeficiency Neg Hx    Urticaria Neg Hx     Social History   Social History Narrative   Not on file     Review of Systems General: Denies fevers, chills, weight loss CV: Denies chest pain, shortness of breath, palpitations Scalp: 2 small nontender masses.  Physical Exam    11/19/2023   10:04 AM 03/06/2022   10:48 AM 08/29/2021   11:11 AM  Vitals with BMI  Height 6' 0    Weight 225 lbs 13 oz 252 lbs 3 oz   BMI 30.62    Systolic 130 132 478  Diastolic 83 70 90  Pulse 74 95 102    General:  No acute distress,  Alert and oriented, Non-Toxic, Normal speech and  affect Scalp: Patient has a mass central portion of the scalp right temporal region.  Both of these are soft nontender and mobile.   Assessment/Plan Subcutaneous masses, scalp: Discussed excision with the patient.  He would like to proceed.  He understands there will be a scar.  He understands I will close the incisions with staples.  The staples will need to be removed approximately 1 week postoperatively.  The risks of bleeding, infection were discussed.  He does understand that if pathology returns as anything other than benign lesion that he may require additional procedures.  All questions were answered to his satisfaction.  Will schedule him for the first week of November at his request.  Teretha Ferguson 11/19/2023, 10:16 AM

## 2023-11-24 ENCOUNTER — Other Ambulatory Visit (HOSPITAL_BASED_OUTPATIENT_CLINIC_OR_DEPARTMENT_OTHER): Payer: Self-pay

## 2023-11-24 ENCOUNTER — Other Ambulatory Visit: Payer: Self-pay

## 2023-12-02 ENCOUNTER — Other Ambulatory Visit (HOSPITAL_BASED_OUTPATIENT_CLINIC_OR_DEPARTMENT_OTHER): Payer: Self-pay

## 2023-12-08 ENCOUNTER — Encounter: Admitting: Urology

## 2023-12-23 ENCOUNTER — Other Ambulatory Visit (HOSPITAL_BASED_OUTPATIENT_CLINIC_OR_DEPARTMENT_OTHER): Payer: Self-pay

## 2023-12-24 ENCOUNTER — Other Ambulatory Visit (HOSPITAL_BASED_OUTPATIENT_CLINIC_OR_DEPARTMENT_OTHER): Payer: Self-pay

## 2023-12-24 MED ORDER — ZEPBOUND 10 MG/0.5ML ~~LOC~~ SOAJ
10.0000 mg | SUBCUTANEOUS | 3 refills | Status: AC
  Filled 2023-12-24: qty 2, 28d supply, fill #0
  Filled 2024-01-29: qty 2, 28d supply, fill #1
  Filled 2024-03-15 – 2024-05-12 (×3): qty 2, 28d supply, fill #2
  Filled 2024-06-14: qty 2, 28d supply, fill #3

## 2023-12-25 ENCOUNTER — Other Ambulatory Visit (HOSPITAL_BASED_OUTPATIENT_CLINIC_OR_DEPARTMENT_OTHER): Payer: Self-pay

## 2023-12-26 ENCOUNTER — Other Ambulatory Visit (HOSPITAL_BASED_OUTPATIENT_CLINIC_OR_DEPARTMENT_OTHER): Payer: Self-pay

## 2023-12-29 ENCOUNTER — Other Ambulatory Visit (HOSPITAL_BASED_OUTPATIENT_CLINIC_OR_DEPARTMENT_OTHER): Payer: Self-pay

## 2023-12-30 ENCOUNTER — Other Ambulatory Visit (HOSPITAL_BASED_OUTPATIENT_CLINIC_OR_DEPARTMENT_OTHER): Payer: Self-pay

## 2023-12-30 ENCOUNTER — Other Ambulatory Visit: Payer: Self-pay

## 2024-01-01 ENCOUNTER — Other Ambulatory Visit (HOSPITAL_BASED_OUTPATIENT_CLINIC_OR_DEPARTMENT_OTHER): Payer: Self-pay

## 2024-01-02 ENCOUNTER — Other Ambulatory Visit (HOSPITAL_BASED_OUTPATIENT_CLINIC_OR_DEPARTMENT_OTHER): Payer: Self-pay

## 2024-01-05 ENCOUNTER — Other Ambulatory Visit (HOSPITAL_BASED_OUTPATIENT_CLINIC_OR_DEPARTMENT_OTHER): Payer: Self-pay

## 2024-01-06 ENCOUNTER — Other Ambulatory Visit (HOSPITAL_BASED_OUTPATIENT_CLINIC_OR_DEPARTMENT_OTHER): Payer: Self-pay

## 2024-01-06 MED ORDER — COLCHICINE 0.6 MG PO TABS
0.6000 mg | ORAL_TABLET | Freq: Two times a day (BID) | ORAL | 1 refills | Status: DC | PRN
  Filled 2024-01-06: qty 60, 30d supply, fill #0
  Filled 2024-01-12 – 2024-05-08 (×2): qty 60, 30d supply, fill #1

## 2024-01-06 MED ORDER — VITAMIN D (ERGOCALCIFEROL) 1.25 MG (50000 UNIT) PO CAPS
50000.0000 [IU] | ORAL_CAPSULE | ORAL | 3 refills | Status: AC
  Filled 2024-01-06 – 2024-05-08 (×3): qty 12, 84d supply, fill #0

## 2024-01-06 MED ORDER — ALLOPURINOL 300 MG PO TABS
300.0000 mg | ORAL_TABLET | Freq: Every day | ORAL | 3 refills | Status: AC
  Filled 2024-01-06 – 2024-05-08 (×2): qty 90, 90d supply, fill #0

## 2024-01-06 MED ORDER — ZEPBOUND 10 MG/0.5ML ~~LOC~~ SOAJ
10.0000 mg | SUBCUTANEOUS | 3 refills | Status: AC
  Filled 2024-01-06 – 2024-01-26 (×2): qty 2, 28d supply, fill #0

## 2024-01-12 ENCOUNTER — Other Ambulatory Visit (HOSPITAL_BASED_OUTPATIENT_CLINIC_OR_DEPARTMENT_OTHER): Payer: Self-pay

## 2024-01-15 ENCOUNTER — Other Ambulatory Visit (HOSPITAL_BASED_OUTPATIENT_CLINIC_OR_DEPARTMENT_OTHER): Payer: Self-pay

## 2024-01-20 ENCOUNTER — Other Ambulatory Visit (HOSPITAL_BASED_OUTPATIENT_CLINIC_OR_DEPARTMENT_OTHER): Payer: Self-pay

## 2024-01-20 MED ORDER — ALBUTEROL SULFATE HFA 108 (90 BASE) MCG/ACT IN AERS
2.0000 | INHALATION_SPRAY | RESPIRATORY_TRACT | 5 refills | Status: AC
  Filled 2024-01-20: qty 6.7, 20d supply, fill #0
  Filled 2024-03-15: qty 6.7, 20d supply, fill #1
  Filled 2024-04-06: qty 6.7, 20d supply, fill #2

## 2024-01-26 ENCOUNTER — Other Ambulatory Visit (HOSPITAL_BASED_OUTPATIENT_CLINIC_OR_DEPARTMENT_OTHER): Payer: Self-pay

## 2024-01-26 ENCOUNTER — Other Ambulatory Visit: Payer: Self-pay

## 2024-01-27 ENCOUNTER — Other Ambulatory Visit (HOSPITAL_BASED_OUTPATIENT_CLINIC_OR_DEPARTMENT_OTHER): Payer: Self-pay

## 2024-01-29 ENCOUNTER — Other Ambulatory Visit: Payer: Self-pay

## 2024-01-29 ENCOUNTER — Other Ambulatory Visit (HOSPITAL_BASED_OUTPATIENT_CLINIC_OR_DEPARTMENT_OTHER): Payer: Self-pay

## 2024-01-29 MED ORDER — MELOXICAM 15 MG PO TABS
15.0000 mg | ORAL_TABLET | Freq: Every day | ORAL | 1 refills | Status: DC
  Filled 2024-01-29 – 2024-04-06 (×4): qty 30, 30d supply, fill #0
  Filled 2024-05-08: qty 30, 30d supply, fill #1

## 2024-02-10 ENCOUNTER — Other Ambulatory Visit (HOSPITAL_BASED_OUTPATIENT_CLINIC_OR_DEPARTMENT_OTHER): Payer: Self-pay

## 2024-03-12 ENCOUNTER — Encounter (HOSPITAL_BASED_OUTPATIENT_CLINIC_OR_DEPARTMENT_OTHER): Payer: Self-pay

## 2024-03-12 ENCOUNTER — Other Ambulatory Visit (HOSPITAL_BASED_OUTPATIENT_CLINIC_OR_DEPARTMENT_OTHER): Payer: Self-pay

## 2024-03-12 ENCOUNTER — Other Ambulatory Visit: Payer: Self-pay

## 2024-03-12 MED ORDER — ZEPBOUND 10 MG/0.5ML ~~LOC~~ SOAJ
10.0000 mg | SUBCUTANEOUS | 3 refills | Status: AC
  Filled 2024-03-12: qty 2, 28d supply, fill #0

## 2024-03-15 ENCOUNTER — Other Ambulatory Visit (HOSPITAL_BASED_OUTPATIENT_CLINIC_OR_DEPARTMENT_OTHER): Payer: Self-pay

## 2024-03-15 ENCOUNTER — Other Ambulatory Visit: Payer: Self-pay

## 2024-03-15 MED ORDER — ZEPBOUND 10 MG/0.5ML ~~LOC~~ SOAJ
10.0000 mg | SUBCUTANEOUS | 3 refills | Status: AC
  Filled 2024-03-15 – 2024-03-16 (×4): qty 2, 28d supply, fill #0
  Filled 2024-04-06 – 2024-04-15 (×2): qty 2, 28d supply, fill #1

## 2024-03-16 ENCOUNTER — Other Ambulatory Visit (HOSPITAL_BASED_OUTPATIENT_CLINIC_OR_DEPARTMENT_OTHER): Payer: Self-pay

## 2024-03-29 ENCOUNTER — Other Ambulatory Visit (HOSPITAL_BASED_OUTPATIENT_CLINIC_OR_DEPARTMENT_OTHER): Payer: Self-pay

## 2024-03-29 MED ORDER — AIRSUPRA 90-80 MCG/ACT IN AERO
2.0000 | INHALATION_SPRAY | RESPIRATORY_TRACT | 11 refills | Status: AC | PRN
  Filled 2024-03-29: qty 10.7, 17d supply, fill #0
  Filled 2024-04-14: qty 10.7, 10d supply, fill #0
  Filled 2024-05-08 – 2024-06-14 (×2): qty 10.7, 10d supply, fill #1

## 2024-04-06 ENCOUNTER — Other Ambulatory Visit (HOSPITAL_BASED_OUTPATIENT_CLINIC_OR_DEPARTMENT_OTHER): Payer: Self-pay

## 2024-04-07 ENCOUNTER — Other Ambulatory Visit: Payer: Self-pay

## 2024-04-07 ENCOUNTER — Other Ambulatory Visit (HOSPITAL_BASED_OUTPATIENT_CLINIC_OR_DEPARTMENT_OTHER): Payer: Self-pay

## 2024-04-07 ENCOUNTER — Encounter: Payer: Self-pay | Admitting: Physician Assistant

## 2024-04-07 ENCOUNTER — Ambulatory Visit: Admitting: Physician Assistant

## 2024-04-07 VITALS — BP 131/90 | HR 91 | Ht 71.0 in | Wt 232.8 lb

## 2024-04-07 DIAGNOSIS — D489 Neoplasm of uncertain behavior, unspecified: Secondary | ICD-10-CM

## 2024-04-07 DIAGNOSIS — R220 Localized swelling, mass and lump, head: Secondary | ICD-10-CM

## 2024-04-07 MED ORDER — ONDANSETRON 4 MG PO TBDP: 4.0000 mg | ORAL_TABLET | Freq: Three times a day (TID) | ORAL | 0 refills | Status: AC | PRN

## 2024-04-07 NOTE — Progress Notes (Signed)
 Patient ID: Bob [name], male    DOB: [birth year], 38 y.o.   MRN: [id number]  Chief Complaint  Patient presents with   Pre-op Exam      ICD-10-CM   1. Neoplasm, uncertain whether benign or malignant  D48.9        History of Present Illness: Bob [name] is a 38 y.o.  male  with a history of scalp masses.  He presents for preoperative evaluation for upcoming procedure, surgical excision of scalp lesions, scheduled for 04/27/2024 with Dr. Waddell.  The patient has not had problems with anesthesia.  He has had multiple prior surgeries, predominantly orthopedic.  While he does have moderate asthma, no recent exacerbations.  He is accompanied by spouse at bedside.  He denies any personal or family history of blood clots or clotting disorder.  No history of significant cardiac or pulmonary disease, inflammatory bowel disease, or varicosities.  He states that they are total of three (3) scalp lesions that he is hoping will be excised at the time of surgery.  Discussed risks of surgery and patient is understanding and agreeable to proceed.  Patient understands to hold his Zepbound  1 week prior to surgery.  Summary of Previous Visit: Patient was seen for initial consult 11/19/2023 by Dr. Waddell.  It was noted that patient uses nicotine (dips).  Plan was for excision with staple closure.  Discussed risks of surgery.  Patient understanding and agreeable.  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   PMH Significant for: Scalp lesions, HTN, asthma, obesity on Zepbound .   Past Medical History: Allergies: No Known Allergies  Current Medications:  Current Outpatient Medications:    albuterol  (VENTOLIN  HFA) 108 (90 Base) MCG/ACT inhaler, INHALE 2 PUFFS BY MOUTH EVERY 4 HOURS AS NEEDED FOR WHEEZE OR FOR SHORTNESS OF BREATH, Disp: 8.5 each, Rfl: 1   albuterol  (VENTOLIN  HFA) 108 (90 Base) MCG/ACT inhaler, Inhale 2 puffs by mouth into the lungs every 4 (four) hours for  wheezing or shortness of breath., Disp: 6.7 g, Rfl: 5   Albuterol -Budesonide (AIRSUPRA) 90-80 MCG/ACT AERO, Inhale 2 puffs into the lungs every 4 (four) hours as needed., Disp: 10.7 g, Rfl: 11   allopurinol  (ZYLOPRIM ) 300 MG tablet, START 1 TABLET DAILY FOR 1 WEEK AFTER YOU HAVE BEEN ON ANTI-INFLAMMATORIES REGULARLY FOR A FULL WEEK. AFTER THE FIRST WEEK GO TO 2 TABLETS DAILY FOR A WEEK AND THEN YOU CAN GO TO 3 TABLETS DAILY., Disp: 90 tablet, Rfl: 0   allopurinol  (ZYLOPRIM ) 300 MG tablet, Take 1 tablet (300 mg total) by mouth daily., Disp: 90 tablet, Rfl: 3   allopurinol  (ZYLOPRIM ) 300 MG tablet, Take 1 tablet (300 mg total) by mouth daily., Disp: 90 tablet, Rfl: 3   BREO ELLIPTA  200-25 MCG/ACT AEPB, INHALE 1 PUFF INTO THE LUNGS DAILY. RINSE, GARGLE AND SPIT OUT AFTER USE., Disp: 60 each, Rfl: 3   colchicine  0.6 MG tablet, Take by mouth., Disp: , Rfl:    colchicine  0.6 MG tablet, Take 1 tablet (0.6 mg total) by mouth 2 (two) times daily as needed for acute gout attack. Stop when better., Disp: 60 tablet, Rfl: 1   colchicine  0.6 MG tablet, Take 1 tablet (0.6 mg total) by mouth 2 (two) times daily as needed for gout attack. Stop when better., Disp: 60 tablet, Rfl: 1   ipratropium (ATROVENT ) 0.03 % nasal spray, PLACE 2 SPRAYS INTO BOTH NOSTRILS 3 (THREE) TIMES DAILY, Disp: 30 mL, Rfl: 0  meloxicam  (MOBIC ) 15 MG tablet, Take 1 tablet (15 mg total) by mouth daily., Disp: 30 tablet, Rfl: 1   meloxicam  (MOBIC ) 7.5 MG tablet, TAKE 1 TABLET BY MOUTH EVERY DAY, Disp: 30 tablet, Rfl: 4   montelukast  (SINGULAIR ) 10 MG tablet, Take 1 tablet (10 mg total) by mouth at bedtime., Disp: 30 tablet, Rfl: 5   Semaglutide (OZEMPIC, 1 MG/DOSE, [place]), Inject into the skin. weekly, Disp: , Rfl:    tirzepatide  (ZEPBOUND ) 10 MG/0.5ML Pen, Inject 10 mg into the skin once a week., Disp: 2 mL, Rfl: 3   tirzepatide  (ZEPBOUND ) 10 MG/0.5ML Pen, Inject 10 mg into the skin once a week., Disp: 2 mL, Rfl: 3   tirzepatide  (ZEPBOUND ) 10  MG/0.5ML Pen, Inject 10 mg into the skin once a week., Disp: 2 mL, Rfl: 3   tirzepatide  (ZEPBOUND ) 10 MG/0.5ML Pen, Inject 10 mg into the skin once a week., Disp: 2 mL, Rfl: 3   tirzepatide  (ZEPBOUND ) 7.5 MG/0.5ML Pen, Inject 7.5 mg into the skin once a week., Disp: 2 mL, Rfl: 3   tirzepatide  (ZEPBOUND ) 7.5 MG/0.5ML Pen, Inject 7.5 mg into the skin once a week., Disp: 2 mL, Rfl: 3   tirzepatide  (ZEPBOUND ) 7.5 MG/0.5ML Pen, Inject 7.5 mg into the skin once a week., Disp: 2 mL, Rfl: 3   Vitamin D , Ergocalciferol , (DRISDOL ) 1.25 MG (50000 UNIT) CAPS capsule, Take 1 capsule (50,000 Units total) by mouth once a week at 9am., Disp: 12 capsule, Rfl: 3   Vitamin D , Ergocalciferol , (DRISDOL ) 1.25 MG (50000 UNIT) CAPS capsule, Take 1 capsule (50,000 Units total) by mouth once a week at 9am, Disp: 12 capsule, Rfl: 3   azithromycin  (ZITHROMAX ) 250 MG tablet, Take 2 tablets on day 1, then 1 tablet daily on days 2 through 5., Disp: 6 each, Rfl: 0   azithromycin  (ZITHROMAX ) 500 MG tablet, Take 1 tablet (500 mg total) by mouth daily for 5 days., Disp: 5 tablet, Rfl: 0   doxycycline  (VIBRA -TABS) 100 MG tablet, Take 1 tablet (100 mg total) by mouth 2 (two) times daily for 10 days., Disp: 20 tablet, Rfl: 0  Past Medical Problems: Past Medical History:  Diagnosis Date   Asthma    Pneumonia     Past Surgical History: Past Surgical History:  Procedure Laterality Date   KNEE SURGERY     Left wrist surgery Left 05/14/2014   OPEN REDUCTION INTERNAL FIXATION (ORIF) DISTAL RADIAL FRACTURE Left 12/09/2018   Procedure: OPEN REDUCTION INTERNAL FIXATION (ORIF) DISTAL RADIAL FRACTURE;  Surgeon: Barbarann Oneil BROCKS, MD;  Location: [facility] OR;  Service: Orthopedics;  Laterality: Left;    Social History: Social History   Socioeconomic History   Marital status: Married    Spouse name: Not on file   Number of children: Not on file   Years of education: Not on file   Highest education level: Not on file  Occupational History    Not on file  Tobacco Use   Smoking status: Never   Smokeless tobacco: Current    Types: Chew  Vaping Use   Vaping status: Never Used  Substance and Sexual Activity   Alcohol use: Yes    Alcohol/week: 5.0 standard drinks of alcohol    Types: 5 Standard drinks or equivalent per week    Comment: social   Drug use: No   Sexual activity: Yes  Other Topics Concern   Not on file  Social History Narrative   Not on file   Social Drivers of Health  Financial Resource Strain: Low Risk  (08/21/2023)   [facility]   Overall Financial Resource Strain (CARDIA)    Difficulty of Paying Living Expenses: Not hard at all  Food Insecurity: No Food Insecurity (08/21/2023)   Received from [facility]   Hunger Vital Sign    Within the past 12 months, you worried that your food would run out before you got the money to buy more.: Never true    Within the past 12 months, the food you bought just didn't last and you didn't have money to get more.: Never true  Transportation Needs: No Transportation Needs (08/21/2023)   Received from [facility] - Transportation    Lack of Transportation (Medical): No    Lack of Transportation (Non-Medical): No  Physical Activity: Sufficiently Active (08/21/2023)   Received from [facility]   Exercise Vital Sign    On average, how many days per week do you engage in moderate to strenuous exercise (like a brisk walk)?: 5 days    On average, how many minutes do you engage in exercise at this level?: 60 min  Stress: No Stress Concern Present (08/21/2023)   Received from [facility] - Occupational Stress Questionnaire    Feeling of Stress : Not at all  Social Connections: Socially Integrated (08/21/2023)   Received from [facility]   Social Network    How would you rate your social network (family, work, friends)?: Good participation with social networks  Intimate Partner Violence: Not At Risk  (08/21/2023)   Received from [facility]   HITS    Over the last 12 months how often did your partner physically hurt you?: Never    Over the last 12 months how often did your partner insult you or talk down to you?: Never    Over the last 12 months how often did your partner threaten you with physical harm?: Never    Over the last 12 months how often did your partner scream or curse at you?: Never    Family History: Family History  Problem Relation Age of Onset   Allergic rhinitis Sister    Asthma Sister    Angioedema Neg Hx    Eczema Neg Hx    Immunodeficiency Neg Hx    Urticaria Neg Hx     Review of Systems: ROS Denies recent trauma or infection.  Physical Exam: Vital Signs BP (!) 131/90 (BP Location: Left Arm, Patient Position: Sitting, Cuff Size: Large)   Pulse 91   Ht 5' 11 (1.803 m)   Wt 232 lb 12.8 oz (105.6 kg)   SpO2 98%   BMI 32.47 kg/m   Physical Exam Constitutional:      General: Not in acute distress.    Appearance: Normal appearance. Not ill-appearing.  HENT:     Head: Normocephalic and atraumatic.  Eyes:     Pupils: Pupils are equal, round. Cardiovascular:     Rate and Rhythm: Normal rate.    Pulses: Normal pulses.  Pulmonary:     Effort: No respiratory distress or increased work of breathing.  Speaks in full sentences. Abdominal:     General: Abdomen is flat. No distension.   Musculoskeletal: Normal range of motion. No lower extremity swelling or edema. No varicosities. Skin:    General: Skin is warm and dry.     Findings: No erythema or rash.  Neurological:     Mental Status: Alert and oriented to  person, place, and time.  Psychiatric:        Mood and Affect: Mood normal.        Behavior: Behavior normal.    Assessment/Plan: The patient is scheduled for excision of scalp lesions with Dr. Waddell.  Risks, benefits, and alternatives of procedure discussed, questions answered and consent obtained.    Smoking Status: Non-smoker, but admits  to nicotine pouches/dip.  Caprini Score: 3; Risk Factors include: BMI greater than 25 and length of planned surgery. Recommendation for mechanical prophylaxis. Encourage early ambulation.   Pictures obtained: 11/19/2023  Post-op Rx sent to pharmacy: Zofran .  He declines narcotics.  Patient was provided with the General Surgical Risk consent document and Pain Medication Agreement prior to their appointment.  They had adequate time to read through the risk consent documents and Pain Medication Agreement. We also discussed them in person together during this preop appointment. All of their questions were answered to their satisfaction.  Recommended calling if they have any further questions.  Risk consent form and Pain Medication Agreement to be scanned into patient's chart.    Electronically signed by: Honora Seip, PA-C 04/07/2024 10:54 AM

## 2024-04-07 NOTE — H&P (View-Only) (Signed)
 Patient ID: FOX [name], male    DOB: [birth year], 38 y.o.   MRN: [id number]  Chief Complaint  Patient presents with   Pre-op Exam      ICD-10-CM   1. Neoplasm, uncertain whether benign or malignant  D48.9        History of Present Illness: Bob [name] is a 38 y.o.  male  with a history of scalp masses.  He presents for preoperative evaluation for upcoming procedure, surgical excision of scalp lesions, scheduled for 04/27/2024 with Dr. Waddell.  The patient has not had problems with anesthesia.  He has had multiple prior surgeries, predominantly orthopedic.  While he does have moderate asthma, no recent exacerbations.  He is accompanied by spouse at bedside.  He denies any personal or family history of blood clots or clotting disorder.  No history of significant cardiac or pulmonary disease, inflammatory bowel disease, or varicosities.  He states that they are total of three (3) scalp lesions that he is hoping will be excised at the time of surgery.  Discussed risks of surgery and patient is understanding and agreeable to proceed.  Patient understands to hold his Zepbound  1 week prior to surgery.  Summary of Previous Visit: Patient was seen for initial consult 11/19/2023 by Dr. Waddell.  It was noted that patient uses nicotine (dips).  Plan was for excision with staple closure.  Discussed risks of surgery.  Patient understanding and agreeable.  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   PMH Significant for: Scalp lesions, HTN, asthma, obesity on Zepbound .   Past Medical History: Allergies: No Known Allergies  Current Medications:  Current Outpatient Medications:    albuterol  (VENTOLIN  HFA) 108 (90 Base) MCG/ACT inhaler, INHALE 2 PUFFS BY MOUTH EVERY 4 HOURS AS NEEDED FOR WHEEZE OR FOR SHORTNESS OF BREATH, Disp: 8.5 each, Rfl: 1   albuterol  (VENTOLIN  HFA) 108 (90 Base) MCG/ACT inhaler, Inhale 2 puffs by mouth into the lungs every 4 (four) hours for  wheezing or shortness of breath., Disp: 6.7 g, Rfl: 5   Albuterol -Budesonide (AIRSUPRA) 90-80 MCG/ACT AERO, Inhale 2 puffs into the lungs every 4 (four) hours as needed., Disp: 10.7 g, Rfl: 11   allopurinol  (ZYLOPRIM ) 300 MG tablet, START 1 TABLET DAILY FOR 1 WEEK AFTER YOU HAVE BEEN ON ANTI-INFLAMMATORIES REGULARLY FOR A FULL WEEK. AFTER THE FIRST WEEK GO TO 2 TABLETS DAILY FOR A WEEK AND THEN YOU CAN GO TO 3 TABLETS DAILY., Disp: 90 tablet, Rfl: 0   allopurinol  (ZYLOPRIM ) 300 MG tablet, Take 1 tablet (300 mg total) by mouth daily., Disp: 90 tablet, Rfl: 3   allopurinol  (ZYLOPRIM ) 300 MG tablet, Take 1 tablet (300 mg total) by mouth daily., Disp: 90 tablet, Rfl: 3   BREO ELLIPTA  200-25 MCG/ACT AEPB, INHALE 1 PUFF INTO THE LUNGS DAILY. RINSE, GARGLE AND SPIT OUT AFTER USE., Disp: 60 each, Rfl: 3   colchicine  0.6 MG tablet, Take by mouth., Disp: , Rfl:    colchicine  0.6 MG tablet, Take 1 tablet (0.6 mg total) by mouth 2 (two) times daily as needed for acute gout attack. Stop when better., Disp: 60 tablet, Rfl: 1   colchicine  0.6 MG tablet, Take 1 tablet (0.6 mg total) by mouth 2 (two) times daily as needed for gout attack. Stop when better., Disp: 60 tablet, Rfl: 1   ipratropium (ATROVENT ) 0.03 % nasal spray, PLACE 2 SPRAYS INTO BOTH NOSTRILS 3 (THREE) TIMES DAILY, Disp: 30 mL, Rfl: 0  meloxicam  (MOBIC ) 15 MG tablet, Take 1 tablet (15 mg total) by mouth daily., Disp: 30 tablet, Rfl: 1   meloxicam  (MOBIC ) 7.5 MG tablet, TAKE 1 TABLET BY MOUTH EVERY DAY, Disp: 30 tablet, Rfl: 4   montelukast  (SINGULAIR ) 10 MG tablet, Take 1 tablet (10 mg total) by mouth at bedtime., Disp: 30 tablet, Rfl: 5   Semaglutide (OZEMPIC, 1 MG/DOSE, [place]), Inject into the skin. weekly, Disp: , Rfl:    tirzepatide  (ZEPBOUND ) 10 MG/0.5ML Pen, Inject 10 mg into the skin once a week., Disp: 2 mL, Rfl: 3   tirzepatide  (ZEPBOUND ) 10 MG/0.5ML Pen, Inject 10 mg into the skin once a week., Disp: 2 mL, Rfl: 3   tirzepatide  (ZEPBOUND ) 10  MG/0.5ML Pen, Inject 10 mg into the skin once a week., Disp: 2 mL, Rfl: 3   tirzepatide  (ZEPBOUND ) 10 MG/0.5ML Pen, Inject 10 mg into the skin once a week., Disp: 2 mL, Rfl: 3   tirzepatide  (ZEPBOUND ) 7.5 MG/0.5ML Pen, Inject 7.5 mg into the skin once a week., Disp: 2 mL, Rfl: 3   tirzepatide  (ZEPBOUND ) 7.5 MG/0.5ML Pen, Inject 7.5 mg into the skin once a week., Disp: 2 mL, Rfl: 3   tirzepatide  (ZEPBOUND ) 7.5 MG/0.5ML Pen, Inject 7.5 mg into the skin once a week., Disp: 2 mL, Rfl: 3   Vitamin D , Ergocalciferol , (DRISDOL ) 1.25 MG (50000 UNIT) CAPS capsule, Take 1 capsule (50,000 Units total) by mouth once a week at 9am., Disp: 12 capsule, Rfl: 3   Vitamin D , Ergocalciferol , (DRISDOL ) 1.25 MG (50000 UNIT) CAPS capsule, Take 1 capsule (50,000 Units total) by mouth once a week at 9am, Disp: 12 capsule, Rfl: 3   azithromycin  (ZITHROMAX ) 250 MG tablet, Take 2 tablets on day 1, then 1 tablet daily on days 2 through 5., Disp: 6 each, Rfl: 0   azithromycin  (ZITHROMAX ) 500 MG tablet, Take 1 tablet (500 mg total) by mouth daily for 5 days., Disp: 5 tablet, Rfl: 0   doxycycline  (VIBRA -TABS) 100 MG tablet, Take 1 tablet (100 mg total) by mouth 2 (two) times daily for 10 days., Disp: 20 tablet, Rfl: 0  Past Medical Problems: Past Medical History:  Diagnosis Date   Asthma    Pneumonia     Past Surgical History: Past Surgical History:  Procedure Laterality Date   KNEE SURGERY     Left wrist surgery Left 05/14/2014   OPEN REDUCTION INTERNAL FIXATION (ORIF) DISTAL RADIAL FRACTURE Left 12/09/2018   Procedure: OPEN REDUCTION INTERNAL FIXATION (ORIF) DISTAL RADIAL FRACTURE;  Surgeon: Barbarann Oneil BROCKS, MD;  Location: [facility] OR;  Service: Orthopedics;  Laterality: Left;    Social History: Social History   Socioeconomic History   Marital status: Married    Spouse name: Not on file   Number of children: Not on file   Years of education: Not on file   Highest education level: Not on file  Occupational History    Not on file  Tobacco Use   Smoking status: Never   Smokeless tobacco: Current    Types: Chew  Vaping Use   Vaping status: Never Used  Substance and Sexual Activity   Alcohol use: Yes    Alcohol/week: 5.0 standard drinks of alcohol    Types: 5 Standard drinks or equivalent per week    Comment: social   Drug use: No   Sexual activity: Yes  Other Topics Concern   Not on file  Social History Narrative   Not on file   Social Drivers of Health  Financial Resource Strain: Low Risk  (08/21/2023)   [facility]   Overall Financial Resource Strain (CARDIA)    Difficulty of Paying Living Expenses: Not hard at all  Food Insecurity: No Food Insecurity (08/21/2023)   Received from [facility]   Hunger Vital Sign    Within the past 12 months, you worried that your food would run out before you got the money to buy more.: Never true    Within the past 12 months, the food you bought just didn't last and you didn't have money to get more.: Never true  Transportation Needs: No Transportation Needs (08/21/2023)   Received from [facility] - Transportation    Lack of Transportation (Medical): No    Lack of Transportation (Non-Medical): No  Physical Activity: Sufficiently Active (08/21/2023)   Received from [facility]   Exercise Vital Sign    On average, how many days per week do you engage in moderate to strenuous exercise (like a brisk walk)?: 5 days    On average, how many minutes do you engage in exercise at this level?: 60 min  Stress: No Stress Concern Present (08/21/2023)   Received from [facility] - Occupational Stress Questionnaire    Feeling of Stress : Not at all  Social Connections: Socially Integrated (08/21/2023)   Received from [facility]   Social Network    How would you rate your social network (family, work, friends)?: Good participation with social networks  Intimate Partner Violence: Not At Risk  (08/21/2023)   Received from [facility]   HITS    Over the last 12 months how often did your partner physically hurt you?: Never    Over the last 12 months how often did your partner insult you or talk down to you?: Never    Over the last 12 months how often did your partner threaten you with physical harm?: Never    Over the last 12 months how often did your partner scream or curse at you?: Never    Family History: Family History  Problem Relation Age of Onset   Allergic rhinitis Sister    Asthma Sister    Angioedema Neg Hx    Eczema Neg Hx    Immunodeficiency Neg Hx    Urticaria Neg Hx     Review of Systems: ROS Denies recent trauma or infection.  Physical Exam: Vital Signs BP (!) 131/90 (BP Location: Left Arm, Patient Position: Sitting, Cuff Size: Large)   Pulse 91   Ht 5' 11 (1.803 m)   Wt 232 lb 12.8 oz (105.6 kg)   SpO2 98%   BMI 32.47 kg/m   Physical Exam Constitutional:      General: Not in acute distress.    Appearance: Normal appearance. Not ill-appearing.  HENT:     Head: Normocephalic and atraumatic.  Eyes:     Pupils: Pupils are equal, round. Cardiovascular:     Rate and Rhythm: Normal rate.    Pulses: Normal pulses.  Pulmonary:     Effort: No respiratory distress or increased work of breathing.  Speaks in full sentences. Abdominal:     General: Abdomen is flat. No distension.   Musculoskeletal: Normal range of motion. No lower extremity swelling or edema. No varicosities. Skin:    General: Skin is warm and dry.     Findings: No erythema or rash.  Neurological:     Mental Status: Alert and oriented to  person, place, and time.  Psychiatric:        Mood and Affect: Mood normal.        Behavior: Behavior normal.    Assessment/Plan: The patient is scheduled for excision of scalp lesions with Dr. Waddell.  Risks, benefits, and alternatives of procedure discussed, questions answered and consent obtained.    Smoking Status: Non-smoker, but admits  to nicotine pouches/dip.  Caprini Score: 3; Risk Factors include: BMI greater than 25 and length of planned surgery. Recommendation for mechanical prophylaxis. Encourage early ambulation.   Pictures obtained: 11/19/2023  Post-op Rx sent to pharmacy: Zofran .  He declines narcotics.  Patient was provided with the General Surgical Risk consent document and Pain Medication Agreement prior to their appointment.  They had adequate time to read through the risk consent documents and Pain Medication Agreement. We also discussed them in person together during this preop appointment. All of their questions were answered to their satisfaction.  Recommended calling if they have any further questions.  Risk consent form and Pain Medication Agreement to be scanned into patient's chart.    Electronically signed by: Honora Seip, PA-C 04/07/2024 10:54 AM

## 2024-04-08 ENCOUNTER — Other Ambulatory Visit (HOSPITAL_BASED_OUTPATIENT_CLINIC_OR_DEPARTMENT_OTHER): Payer: Self-pay

## 2024-04-09 ENCOUNTER — Other Ambulatory Visit (HOSPITAL_BASED_OUTPATIENT_CLINIC_OR_DEPARTMENT_OTHER): Payer: Self-pay

## 2024-04-13 ENCOUNTER — Other Ambulatory Visit (HOSPITAL_BASED_OUTPATIENT_CLINIC_OR_DEPARTMENT_OTHER): Payer: Self-pay

## 2024-04-14 ENCOUNTER — Other Ambulatory Visit (HOSPITAL_BASED_OUTPATIENT_CLINIC_OR_DEPARTMENT_OTHER): Payer: Self-pay

## 2024-04-15 ENCOUNTER — Other Ambulatory Visit (HOSPITAL_BASED_OUTPATIENT_CLINIC_OR_DEPARTMENT_OTHER): Payer: Self-pay

## 2024-04-20 ENCOUNTER — Encounter (HOSPITAL_BASED_OUTPATIENT_CLINIC_OR_DEPARTMENT_OTHER): Payer: Self-pay | Admitting: Plastic Surgery

## 2024-04-22 ENCOUNTER — Encounter (HOSPITAL_BASED_OUTPATIENT_CLINIC_OR_DEPARTMENT_OTHER): Payer: Self-pay | Admitting: Plastic Surgery

## 2024-04-22 ENCOUNTER — Other Ambulatory Visit: Payer: Self-pay

## 2024-04-26 NOTE — Anesthesia Preprocedure Evaluation (Signed)
 Anesthesia Evaluation  Patient identified by MRN, date of birth, ID band Patient awake    Reviewed: Allergy & Precautions, NPO status , Patient's Chart, lab work & pertinent test results  History of Anesthesia Complications Negative for: history of anesthetic complications  Airway Mallampati: III  TM Distance: >3 FB Neck ROM: Full    Dental  (+) Dental Advisory Given   Pulmonary neg shortness of breath, asthma , neg sleep apnea, neg COPD, neg recent URI   Pulmonary exam normal breath sounds clear to auscultation       Cardiovascular negative cardio ROS  Rhythm:Regular Rate:Normal     Neuro/Psych negative neurological ROS     GI/Hepatic negative GI ROS, Neg liver ROS,,,  Endo/Other  negative endocrine ROS    Renal/GU negative Renal ROS     Musculoskeletal  (+) Arthritis ,    Abdominal   Peds  Hematology negative hematology ROS (+)   Anesthesia Other Findings Last Zepbound : 04/15/2024  Reproductive/Obstetrics                              Anesthesia Physical Anesthesia Plan  ASA: 2  Anesthesia Plan: General   Post-op Pain Management: Tylenol  PO (pre-op)*   Induction: Intravenous  PONV Risk Score and Plan: 2 and Ondansetron , Dexamethasone , Midazolam  and Treatment may vary due to age or medical condition  Airway Management Planned: LMA  Additional Equipment:   Intra-op Plan:   Post-operative Plan: Extubation in OR  Informed Consent: I have reviewed the patients History and Physical, chart, labs and discussed the procedure including the risks, benefits and alternatives for the proposed anesthesia with the patient or authorized representative who has indicated his/her understanding and acceptance.     Dental advisory given  Plan Discussed with: CRNA and Anesthesiologist  Anesthesia Plan Comments: (Risks of general anesthesia discussed including, but not limited to, sore  throat, hoarse voice, chipped/damaged teeth, injury to vocal cords, nausea and vomiting, allergic reactions, lung infection, heart attack, stroke, and death. All questions answered. )         Anesthesia Quick Evaluation

## 2024-04-27 ENCOUNTER — Ambulatory Visit (HOSPITAL_BASED_OUTPATIENT_CLINIC_OR_DEPARTMENT_OTHER): Admitting: Anesthesiology

## 2024-04-27 ENCOUNTER — Encounter (HOSPITAL_BASED_OUTPATIENT_CLINIC_OR_DEPARTMENT_OTHER): Payer: Self-pay | Admitting: Plastic Surgery

## 2024-04-27 ENCOUNTER — Encounter (HOSPITAL_BASED_OUTPATIENT_CLINIC_OR_DEPARTMENT_OTHER)
Admission: RE | Disposition: A | Discharge status: Home / Self Care | Payer: Self-pay | Source: Home / Self Care | Attending: Plastic Surgery

## 2024-04-27 ENCOUNTER — Ambulatory Visit (HOSPITAL_BASED_OUTPATIENT_CLINIC_OR_DEPARTMENT_OTHER)
Admission: RE | Admit: 2024-04-27 | Discharge: 2024-04-27 | Disposition: A | Discharge status: Home / Self Care | Attending: Plastic Surgery | Admitting: Plastic Surgery

## 2024-04-27 ENCOUNTER — Other Ambulatory Visit: Payer: Self-pay

## 2024-04-27 DIAGNOSIS — L7211 Pilar cyst: Secondary | ICD-10-CM | POA: Diagnosis not present

## 2024-04-27 DIAGNOSIS — J45909 Unspecified asthma, uncomplicated: Secondary | ICD-10-CM | POA: Diagnosis not present

## 2024-04-27 DIAGNOSIS — L729 Follicular cyst of the skin and subcutaneous tissue, unspecified: Secondary | ICD-10-CM

## 2024-04-27 DIAGNOSIS — F17220 Nicotine dependence, chewing tobacco, uncomplicated: Secondary | ICD-10-CM | POA: Diagnosis not present

## 2024-04-27 DIAGNOSIS — D489 Neoplasm of uncertain behavior, unspecified: Secondary | ICD-10-CM | POA: Diagnosis not present

## 2024-04-27 HISTORY — DX: Unspecified osteoarthritis, unspecified site: M19.90

## 2024-04-27 HISTORY — PX: LESION REMOVAL: SHX5196

## 2024-04-27 SURGERY — WIDE EXCISION, LESION, UPPER EXTREMITY
Anesthesia: Monitor Anesthesia Care

## 2024-04-27 MED ORDER — LIDOCAINE 2% (20 MG/ML) 5 ML SYRINGE
INTRAMUSCULAR | Status: AC
Start: 2024-04-27 — End: 2024-04-27
  Filled 2024-04-27: qty 5

## 2024-04-27 MED ORDER — MIDAZOLAM HCL 2 MG/2ML IJ SOLN
INTRAMUSCULAR | Status: AC
  Filled 2024-04-27: qty 2

## 2024-04-27 MED ORDER — PROPOFOL 500 MG/50ML IV EMUL
INTRAVENOUS | Status: AC
Start: 2024-04-27 — End: 2024-04-27
  Filled 2024-04-27: qty 50

## 2024-04-27 MED ORDER — FENTANYL CITRATE (PF) 100 MCG/2ML IJ SOLN
25.0000 ug | INTRAMUSCULAR | Status: DC | PRN
  Administered 2024-04-27 (×2): 50 ug via INTRAVENOUS

## 2024-04-27 MED ORDER — DEXAMETHASONE SOD PHOSPHATE PF 10 MG/ML IJ SOLN
INTRAMUSCULAR | Status: DC | PRN
  Administered 2024-04-27: 10 mg via INTRAVENOUS

## 2024-04-27 MED ORDER — FENTANYL CITRATE (PF) 100 MCG/2ML IJ SOLN
INTRAMUSCULAR | Status: AC
  Filled 2024-04-27: qty 2

## 2024-04-27 MED ORDER — ACETAMINOPHEN 500 MG PO TABS
ORAL_TABLET | ORAL | Status: AC
  Filled 2024-04-27: qty 2

## 2024-04-27 MED ORDER — PROPOFOL 10 MG/ML IV BOLUS
INTRAVENOUS | Status: DC | PRN
  Administered 2024-04-27: 200 mg via INTRAVENOUS

## 2024-04-27 MED ORDER — 0.9 % SODIUM CHLORIDE (POUR BTL) OPTIME
TOPICAL | Status: DC | PRN
  Administered 2024-04-27: 1000 mL

## 2024-04-27 MED ORDER — MIDAZOLAM HCL (PF) 2 MG/2ML IJ SOLN
INTRAMUSCULAR | Status: DC | PRN
  Administered 2024-04-27: 2 mg via INTRAVENOUS

## 2024-04-27 MED ORDER — LACTATED RINGERS IV SOLN: INTRAVENOUS | Status: DC

## 2024-04-27 MED ORDER — LIDOCAINE 2% (20 MG/ML) 5 ML SYRINGE
INTRAMUSCULAR | Status: DC | PRN
  Administered 2024-04-27: 100 mg via INTRAVENOUS

## 2024-04-27 MED ORDER — CHLORHEXIDINE GLUCONATE CLOTH 2 % EX PADS: 6.0000 | MEDICATED_PAD | Freq: Once | CUTANEOUS | Status: DC

## 2024-04-27 MED ORDER — DEXMEDETOMIDINE HCL IN NACL 80 MCG/20ML IV SOLN
INTRAVENOUS | Status: DC | PRN
  Administered 2024-04-27: 8 ug via INTRAVENOUS

## 2024-04-27 MED ORDER — DEXMEDETOMIDINE HCL IN NACL 80 MCG/20ML IV SOLN
INTRAVENOUS | Status: AC
Start: 2024-04-27 — End: 2024-04-27
  Filled 2024-04-27: qty 20

## 2024-04-27 MED ORDER — CEFAZOLIN SODIUM-DEXTROSE 2-4 GM/100ML-% IV SOLN
2.0000 g | INTRAVENOUS | Status: AC
  Administered 2024-04-27: 2 g via INTRAVENOUS

## 2024-04-27 MED ORDER — ACETAMINOPHEN 500 MG PO TABS
1000.0000 mg | ORAL_TABLET | Freq: Once | ORAL | Status: AC
  Administered 2024-04-27: 1000 mg via ORAL

## 2024-04-27 MED ORDER — OXYCODONE HCL 5 MG PO TABS: 5.0000 mg | ORAL_TABLET | Freq: Once | ORAL | Status: DC | PRN

## 2024-04-27 MED ORDER — BUPIVACAINE-EPINEPHRINE 0.25% -1:200000 IJ SOLN
INTRAMUSCULAR | Status: DC | PRN
  Administered 2024-04-27: 8 mL

## 2024-04-27 MED ORDER — AMISULPRIDE (ANTIEMETIC) 5 MG/2ML IV SOLN
10.0000 mg | Freq: Once | INTRAVENOUS | Status: DC | PRN
Start: 2024-04-27 — End: 2024-04-27

## 2024-04-27 MED ORDER — PROPOFOL 500 MG/50ML IV EMUL
INTRAVENOUS | Status: AC
  Filled 2024-04-27: qty 50

## 2024-04-27 MED ORDER — CEFAZOLIN SODIUM-DEXTROSE 2-4 GM/100ML-% IV SOLN
INTRAVENOUS | Status: AC
  Filled 2024-04-27: qty 100

## 2024-04-27 MED ORDER — OXYCODONE HCL 5 MG/5ML PO SOLN: 5.0000 mg | Freq: Once | ORAL | Status: DC | PRN

## 2024-04-27 MED ORDER — FENTANYL CITRATE (PF) 100 MCG/2ML IJ SOLN
INTRAMUSCULAR | Status: DC | PRN
  Administered 2024-04-27 (×2): 50 ug via INTRAVENOUS

## 2024-04-27 SURGICAL SUPPLY — 55 items
BLADE CLIPPER SURG (BLADE) IMPLANT
BLADE SURG 15 STRL LF DISP TIS (BLADE) ×1 IMPLANT
BNDG ELASTIC 2INX 5YD STR LF (GAUZE/BANDAGES/DRESSINGS) IMPLANT
CANISTER SUCT 1200ML W/VALVE (MISCELLANEOUS) IMPLANT
CNTNR URN SCR LID CUP LEK RST (MISCELLANEOUS) IMPLANT
CORD BIPOLAR FORCEPS 12FT (ELECTRODE) IMPLANT
COVER BACK TABLE 60X90IN (DRAPES) ×1 IMPLANT
COVER MAYO STAND STRL (DRAPES) ×1 IMPLANT
DERMABOND ADVANCED .7 DNX12 (GAUZE/BANDAGES/DRESSINGS) IMPLANT
DRAPE LAPAROTOMY 100X72 PEDS (DRAPES) IMPLANT
DRAPE U-SHAPE 76X120 STRL (DRAPES) IMPLANT
DRESSING MEPILEX FLEX 4X4 (GAUZE/BANDAGES/DRESSINGS) IMPLANT
DRSG MEPILEX POST OP 4X8 (GAUZE/BANDAGES/DRESSINGS) IMPLANT
DRSG TEGADERM 2-3/8X2-3/4 SM (GAUZE/BANDAGES/DRESSINGS) IMPLANT
ELECT NDL BLADE 2-5/6 (NEEDLE) ×1 IMPLANT
ELECTRODE REM PT RETRN 9FT PED (ELECTROSURGICAL) IMPLANT
ELECTRODE REM PT RTRN 9FT ADLT (ELECTROSURGICAL) IMPLANT
GAUZE SPONGE 2X2 STRL 8-PLY (GAUZE/BANDAGES/DRESSINGS) IMPLANT
GAUZE SPONGE 4X4 12PLY STRL LF (GAUZE/BANDAGES/DRESSINGS) IMPLANT
GAUZE STRETCH 2X75IN STRL (MISCELLANEOUS) IMPLANT
GAUZE XEROFORM 1X8 LF (GAUZE/BANDAGES/DRESSINGS) IMPLANT
GLOVE BIO SURGEON STRL SZ 6.5 (GLOVE) IMPLANT
GLOVE BIO SURGEON STRL SZ7.5 (GLOVE) IMPLANT
GLOVE BIO SURGEON STRL SZ8 (GLOVE) ×1 IMPLANT
GLOVE BIOGEL PI IND STRL 7.0 (GLOVE) IMPLANT
GLOVE BIOGEL PI IND STRL 8 (GLOVE) ×1 IMPLANT
GOWN STRL REUS W/ TWL LRG LVL3 (GOWN DISPOSABLE) ×2 IMPLANT
NDL HYPO 30GX1 BEV (NEEDLE) ×1 IMPLANT
NDL PRECISIONGLIDE 27X1.5 (NEEDLE) IMPLANT
PACK BASIN DAY SURGERY FS (CUSTOM PROCEDURE TRAY) ×1 IMPLANT
PENCIL SMOKE EVACUATOR (MISCELLANEOUS) IMPLANT
SHEET MEDIUM DRAPE 40X70 STRL (DRAPES) IMPLANT
SOLN 0.9% NACL POUR BTL 1000ML (IV SOLUTION) IMPLANT
SPIKE FLUID TRANSFER (MISCELLANEOUS) ×1 IMPLANT
STRIP CLOSURE SKIN 1/2X4 (GAUZE/BANDAGES/DRESSINGS) IMPLANT
SUCTION TUBE FRAZIER 10FR DISP (SUCTIONS) IMPLANT
SUT ETHILON 3 0 PS 1 (SUTURE) IMPLANT
SUT MNCRL 6-0 UNDY P1 1X18 (SUTURE) IMPLANT
SUT MNCRL AB 4-0 PS2 18 (SUTURE) IMPLANT
SUT MON AB 5-0 P3 18 (SUTURE) IMPLANT
SUT MON AB 5-0 PS2 18 (SUTURE) IMPLANT
SUT PROLENE 4 0 PS 2 18 (SUTURE) IMPLANT
SUT PROLENE 5 0 P 3 (SUTURE) IMPLANT
SUT PROLENE 5 0 PS 2 (SUTURE) IMPLANT
SUT PROLENE 6 0 P 1 18 (SUTURE) IMPLANT
SUT SILK 2 0 SH (SUTURE) IMPLANT
SUT VIC AB 3-0 SH 27X BRD (SUTURE) IMPLANT
SUT VIC AB 4-0 PS2 18 (SUTURE) IMPLANT
SUT VIC AB 5-0 P-3 18X BRD (SUTURE) IMPLANT
SUT VIC AB 5-0 PS2 18 (SUTURE) IMPLANT
SYR BULB EAR ULCER 3OZ GRN STR (SYRINGE) IMPLANT
SYR CONTROL 10ML LL (SYRINGE) ×1 IMPLANT
TOWEL GREEN STERILE FF (TOWEL DISPOSABLE) ×1 IMPLANT
TRAY DSU PREP LF (CUSTOM PROCEDURE TRAY) ×1 IMPLANT
TUBE CONNECTING 20X1/4 (TUBING) IMPLANT

## 2024-04-27 NOTE — Interval H&P Note (Signed)
 History and Physical Interval Note: No change in exam or indication for surgery All questions answered Three sites marked on scalp with his assistance for excision of lesions Will proceed at his request  04/27/2024 7:10 AM  Bob [name]  has presented today for surgery, with the diagnosis of SCALP LESIONS X 2.  The various methods of treatment have been discussed with the patient and family. After consideration of risks, benefits and other options for treatment, the patient has consented to  Procedure(s) with comments: WIDE EXCISION, LESION, UPPER EXTREMITY (N/A) - Excision of 2 scalp lesions as a surgical intervention.  The patient's history has been reviewed, patient examined, no change in status, stable for surgery.  I have reviewed the patient's chart and labs.  Questions were answered to the patient's satisfaction.     [name]

## 2024-04-27 NOTE — Discharge Instructions (Addendum)
 Replace bandages as needed but please keep incisions covered for 24 hours. Please do not get incisions wet for 24 hours. Do not drive for 24 hours or while taking any narcotic pain medicine. Light activity as tolerated. Please call the office, [phone], for any questions or concerns   Post Anesthesia Home Care Instructions  Activity: Get plenty of rest for the remainder of the day. A responsible individual must stay with you for 24 hours following the procedure.  For the next 24 hours, DO NOT: -Drive a car -Advertising copywriter -Drink alcoholic beverages -Take any medication unless instructed by your physician -Make any legal decisions or sign important papers.  Meals: Start with liquid foods such as gelatin or soup. Progress to regular foods as tolerated. Avoid greasy, spicy, heavy foods. If nausea and/or vomiting occur, drink only clear liquids until the nausea and/or vomiting subsides. Call your physician if vomiting continues.  Special Instructions/Symptoms: Your throat may feel dry or sore from the anesthesia or the breathing tube placed in your throat during surgery. If this causes discomfort, gargle with warm salt water. The discomfort should disappear within 24 hours.  May have Tylenol  after 12:40pm if needed.

## 2024-04-27 NOTE — Anesthesia Postprocedure Evaluation (Signed)
 Anesthesia Post Note  Patient: Bob [name]  Procedure(s) Performed: EXCISION SCALP LESIOINS TIMES THREE     Patient location during evaluation: PACU Anesthesia Type: General Level of consciousness: awake Pain management: pain level controlled Vital Signs Assessment: post-procedure vital signs reviewed and stable Respiratory status: spontaneous breathing, nonlabored ventilation and respiratory function stable Cardiovascular status: blood pressure returned to baseline and stable Postop Assessment: no apparent nausea or vomiting Anesthetic complications: no   No notable events documented.  Last Vitals:  Vitals:   04/27/24 0949 04/27/24 1008  BP:  (!) 139/90  Pulse: 76   Resp: 15 16  Temp:  (!) 36.3 C  SpO2: 93% 93%    Last Pain:  Vitals:   04/27/24 1008  TempSrc:   PainSc: 3                  Delon [name]

## 2024-04-27 NOTE — Anesthesia Procedure Notes (Signed)
 Procedure Name: LMA Insertion Date/Time: 04/27/2024 7:33 AM  Performed by: Lenard Lacks, CRNAPre-anesthesia Checklist: Patient identified, Emergency Drugs available, Suction available and Patient being monitored Patient Re-evaluated:Patient Re-evaluated prior to induction Oxygen Delivery Method: Circle system utilized Preoxygenation: Pre-oxygenation with 100% oxygen Induction Type: IV induction LMA: LMA inserted LMA Size: 5.0 Number of attempts: 1 Placement Confirmation: positive ETCO2 and breath sounds checked- equal and bilateral Dental Injury: Teeth and Oropharynx as per pre-operative assessment

## 2024-04-27 NOTE — Transfer of Care (Signed)
 Immediate Anesthesia Transfer of Care Note  Patient: Bob [name]  Procedure(s) Performed: EXCISION SCALP LESIOINS TIMES THREE  Patient Location: PACU  Anesthesia Type:General  Level of Consciousness: sedated  Airway & Oxygen Therapy: Patient connected to nasal cannula oxygen  Post-op Assessment: Report given to RN and Post -op Vital signs reviewed and stable  Post vital signs: Reviewed and stable  Last Vitals:  Vitals Value Taken Time  BP 126/90 04/27/24 08:50  Temp 36.4 C 04/27/24 08:50  Pulse 72 04/27/24 08:53  Resp 15 04/27/24 08:53  SpO2 100 % 04/27/24 08:53  Vitals shown include unfiled device data.  Last Pain:  Vitals:   04/27/24 0850  TempSrc:   PainSc: Asleep         Complications: No notable events documented.

## 2024-04-27 NOTE — Op Note (Signed)
 DATE OF OPERATION: 04/27/2024  LOCATION: [facility] operating Room  PREOPERATIVE DIAGNOSIS: Scalp cyst x 3  POSTOPERATIVE DIAGNOSIS: Same  PROCEDURE: Excision of scalp cyst  SURGEON: Marinell Birmingham, MD  ASSISTANT: Duwaine Amber medical student  EBL: 10 cc  CONDITION: Stable  COMPLICATIONS: None  INDICATION: The patient, Bob [name], is a 38 y.o. male born on 01-Jul-1985, is here for treatment small subcutaneous scalp cysts removed for pathologic diagnosis.   PROCEDURE DETAILS:  The patient was seen prior to surgery and marked.  The IV antibiotics were given. The patient was taken to the operating room and given a general anesthetic. A standard time out was performed and all information was confirmed by those in the room. SCDs were placed.   The left posterior scalp was addressed first.  Was prepped with Betadine and the tissue over the cyst infiltrated with quarter percent Marcaine  with epinephrine .  A 1 cm incision was made over the mass and the mass was removed with a combination of sharp and blunt dissection.  The mass measured approximately 3 mm in length.  The surgical wound was irrigated and closed with interrupted 3-0 nylon sutures.  Attention was turned to the right scalp mass.  After prepping with Betadine the tissues were infiltrated with quarter percent Marcaine  with epinephrine  and a 1 cm incision was made over the mass which was identified and removed with a combination of sharp and blunt dissection.  The mass measured approximately 5 mm in size and the incision measured approximately 1 cm.  It incision was closed with interrupted 3-0 nylon sutures.  Attention was turned to the central scalp mass which was again prepped with Betadine and infiltrated with quarter percent Marcaine  with epinephrine .  An incision was made over the mass and the mass was identified and removed with a combination of sharp and blunt dissection.  The mass measured approximately 1 cm in length.  The  incision measured approximately 1.5 cm.  It was closed with interrupted 3-0 nylon sutures.  Bandages were applied and the patient was awakened from anesthesia without incident.  He was transferred to the recovery room in good condition.  All instrument needle and sponge counts were reported as correct. The patient was allowed to wake up and taken to recovery room in stable condition at the end of the case. The family was notified at the end of the case.

## 2024-04-28 ENCOUNTER — Encounter (HOSPITAL_BASED_OUTPATIENT_CLINIC_OR_DEPARTMENT_OTHER): Payer: Self-pay | Admitting: Plastic Surgery

## 2024-04-28 LAB — SURGICAL PATHOLOGY

## 2024-05-03 ENCOUNTER — Ambulatory Visit (INDEPENDENT_AMBULATORY_CARE_PROVIDER_SITE_OTHER): Admitting: Plastic Surgery

## 2024-05-03 ENCOUNTER — Encounter: Payer: Self-pay | Admitting: Plastic Surgery

## 2024-05-03 VITALS — BP 141/89 | HR 85 | Ht 71.0 in | Wt 229.4 lb

## 2024-05-03 DIAGNOSIS — L7211 Pilar cyst: Secondary | ICD-10-CM

## 2024-05-03 DIAGNOSIS — Z719 Counseling, unspecified: Secondary | ICD-10-CM

## 2024-05-03 NOTE — Progress Notes (Signed)
 Bob [name] returns approximately week postop from removal of 3 cyst on his scalp.  He is doing well no complaints.  On examination the incisions are clean dry and intact.  Sutures were removed without difficulty.  Pathology which showed pilar cysts was reviewed with him.  Discussed scar care including scar massage and sun avoidance.  Follow-up as needed.

## 2024-05-08 ENCOUNTER — Other Ambulatory Visit (HOSPITAL_BASED_OUTPATIENT_CLINIC_OR_DEPARTMENT_OTHER): Payer: Self-pay

## 2024-05-09 ENCOUNTER — Other Ambulatory Visit: Payer: Self-pay

## 2024-05-10 ENCOUNTER — Other Ambulatory Visit: Payer: Self-pay

## 2024-05-10 ENCOUNTER — Other Ambulatory Visit (HOSPITAL_BASED_OUTPATIENT_CLINIC_OR_DEPARTMENT_OTHER): Payer: Self-pay

## 2024-05-12 ENCOUNTER — Other Ambulatory Visit (HOSPITAL_BASED_OUTPATIENT_CLINIC_OR_DEPARTMENT_OTHER): Payer: Self-pay

## 2024-05-17 ENCOUNTER — Encounter: Payer: Self-pay | Admitting: Physician Assistant

## 2024-05-17 ENCOUNTER — Telehealth: Payer: Self-pay | Admitting: Physician Assistant

## 2024-05-17 NOTE — Telephone Encounter (Signed)
 Called and lvmail 05-17-24, apt needs to be r/s PA not in office

## 2024-05-18 ENCOUNTER — Encounter: Payer: Self-pay | Admitting: Physician Assistant

## 2024-06-14 ENCOUNTER — Other Ambulatory Visit (HOSPITAL_BASED_OUTPATIENT_CLINIC_OR_DEPARTMENT_OTHER): Payer: Self-pay

## 2024-06-15 ENCOUNTER — Other Ambulatory Visit (HOSPITAL_BASED_OUTPATIENT_CLINIC_OR_DEPARTMENT_OTHER): Payer: Self-pay

## 2024-06-18 ENCOUNTER — Other Ambulatory Visit (HOSPITAL_BASED_OUTPATIENT_CLINIC_OR_DEPARTMENT_OTHER): Payer: Self-pay

## 2024-06-22 ENCOUNTER — Other Ambulatory Visit (HOSPITAL_BASED_OUTPATIENT_CLINIC_OR_DEPARTMENT_OTHER): Payer: Self-pay

## 2024-06-22 MED ORDER — MELOXICAM 15 MG PO TABS
15.0000 mg | ORAL_TABLET | Freq: Every day | ORAL | 1 refills | Status: AC
  Filled 2024-06-22: qty 30, 30d supply, fill #0

## 2024-06-22 MED ORDER — AIRSUPRA 90-80 MCG/ACT IN AERO
2.0000 | INHALATION_SPRAY | RESPIRATORY_TRACT | 11 refills | Status: AC | PRN
  Filled 2024-06-22: qty 10.7, 10d supply, fill #0

## 2024-06-22 MED ORDER — COLCHICINE 0.6 MG PO TABS
0.6000 mg | ORAL_TABLET | Freq: Two times a day (BID) | ORAL | 1 refills | Status: AC | PRN
  Filled 2024-06-22: qty 60, 30d supply, fill #0

## 2024-06-24 ENCOUNTER — Other Ambulatory Visit (HOSPITAL_BASED_OUTPATIENT_CLINIC_OR_DEPARTMENT_OTHER): Payer: Self-pay
